# Patient Record
Sex: Male | Born: 1940 | Race: Black or African American | Hispanic: No | Smoking: Former smoker
Health system: Southern US, Community
[De-identification: ages and names within clinical notes are randomized; demographics above are authoritative.]

## PROBLEM LIST (undated history)

## (undated) DIAGNOSIS — I10 Essential (primary) hypertension: Secondary | ICD-10-CM

## (undated) DIAGNOSIS — E119 Type 2 diabetes mellitus without complications: Secondary | ICD-10-CM

## (undated) HISTORY — PX: CHOLECYSTECTOMY: SHX55

---

## 2004-03-28 ENCOUNTER — Inpatient Hospital Stay (HOSPITAL_COMMUNITY): Admission: AD | Admit: 2004-03-28 | Discharge: 2004-03-31 | Payer: Self-pay | Admitting: Cardiology

## 2004-04-17 ENCOUNTER — Ambulatory Visit: Payer: Self-pay | Admitting: Cardiology

## 2010-06-14 ENCOUNTER — Ambulatory Visit: Admit: 2010-06-14 | Payer: Self-pay | Admitting: Internal Medicine

## 2010-06-14 ENCOUNTER — Ambulatory Visit (HOSPITAL_COMMUNITY)
Admission: RE | Admit: 2010-06-14 | Discharge: 2010-06-14 | Payer: Self-pay | Source: Home / Self Care | Attending: Internal Medicine | Admitting: Internal Medicine

## 2010-06-19 LAB — GLUCOSE, CAPILLARY: Glucose-Capillary: 182 mg/dL — ABNORMAL HIGH (ref 70–99)

## 2010-06-26 NOTE — Op Note (Signed)
  NAMEDELDRICK, LINCH               ACCOUNT NO.:  [id number]  MEDICAL RECORD NO.:  [id number]          PATIENT TYPE:  AMB  LOCATION:  DAY                           FACILITY:  [facility]  PHYSICIAN:  Lionel December, M.D.    DATE OF BIRTH:  [birth year]  DATE OF PROCEDURE: DATE OF DISCHARGE:  06/14/2010                              OPERATIVE REPORT   PROCEDURE:  Colonoscopy.  INDICATIONS:  Andre Calderon is 70 year old African American male with history of colonic adenomas.  His last exam was 5 years ago and he had two polyps removed.  He is presently free of GI symptoms.  Procedures were reviewed with the patient and informed consent was obtained.  MEDS FOR CONSCIOUS SEDATION:  Demerol 50 mg IV, Versed 4 mg IV in divided dose.  FINDINGS:  Procedure performed in endoscopy suite.  The patient's vital signs and O2 sat were monitored during the procedure and remained stable.  The patient was placed in left lateral recumbent position. Rectal examination performed.  No abnormality noted on external or digital exam.  Pentax videoscope was placed through rectum and advanced under vision into sigmoid colon beyond.  Preparation was satisfactory. However, it was hard to keep the lens clean from the liquid or broth that he had left over in his colon.  As a result, somewhat blurred images.  Scope was passed into cecum which was identified by appendiceal orifice and ileocecal valve.  Pictures were taken for the record.  As the scope was withdrawn, colonic mucosa was carefully examined and no polyps and/or tumor masses were noted.  Rectal mucosa similarly was normal.  Scope was retroflexed to examine anorectal junction which was unremarkable.  Endoscope was then withdrawn.  Withdrawal time was 11 minutes.  The patient tolerated the procedure well.  FINAL DIAGNOSES: 1. Examination performed to cecum. 2. Normal colonoscopy, somewhat limited by quality of prep as above.  RECOMMENDATIONS:  Standard instructions  given.  He should continue his yearly Hemoccult and consider next exam in 5 years from now.     Lionel December, M.D.     NR/MEDQ  D:  06/14/2010  T:  06/15/2010  Job:  [id number]  cc:   Lucianne Lei, MD [facility], [place]  Electronically Signed by Lionel December M.D. on 06/26/2010 12:20:35 PM

## 2010-10-20 NOTE — Cardiovascular Report (Signed)
NAMEJHOEL, STIEG               ACCOUNT NO.:  [id number]   MEDICAL RECORD NO.:  [id number]          PATIENT TYPE:  INP   LOCATION:  4733                         FACILITY:  [facility]   PHYSICIAN:  Arturo Morton. Riley Kill, M.D. [facility] OF BIRTH:  [birth year]   DATE OF PROCEDURE:  DATE OF DISCHARGE:                              CARDIAC CATHETERIZATION   DATE OF STUDY:  March 29, 2004.   INDICATIONS:  The patient presents with chest pain.  It is somewhat  pleuritic in nature.  It was not clearly ischemic.  He had no definite  electrocardiographic abnormalities or positive enzymes.  He had a stress  test, which suggested an anteroapical defect.   SURGEON:  Arturo Morton. Riley Kill, MD, [facility].   PROCEDURES:  1.  Left heart catheterization.  2.  Selective coronary arteriography.  3.  Selective left ventriculography.   DESCRIPTION OF PROCEDURE:  The patient was brought to the cath lab and  prepped and draped in the usual fashion.  Through an anterior puncture, the  right femoral artery was easily entered.  A 6-French sheath was placed.  Central aortic left ventricular pressures were measured with a pigtail.  Ventriculography was performed in the RAO projection.  Views of the left and  right coronary arteries were then obtained in multiple angiographic  projections.  Intracoronary nitroglycerin was given into the right coronary,  and we tried multiple views of the LAD to try to lay this out well.  He was  finally taken to the holding area in satisfactory clinical condition.   HEMODYNAMIC DATA:  1.  Central aortic pressure 135/75, mean 100.  2.  Left ventricular 142/23.  3.  No gradient or pullback across the aortic valve.   ANGIOGRAPHIC DATA:  1.  Ventriculography was performed in the RAO projection.  Overall LV      function was well preserved.  No segmental abnormalities or contractures      were identified.  2.  The left main coronary artery was free of critical disease.  3.  The left  anterior descending artery coursed to the apex.  Between the      origin of the two diagonals is a focal area of tapered narrowing that      does not appear to exceed 40% luminal reduction.  There is very slight      haziness in this area, and I cannot absolutely exclude a possible      ruptured plaque, although in most views it does not appear to be high      grade.  The distal LAD wraps the apex and is free of critical disease.  4.  The circumflex provides several marginal branches and no areas of high-      grade focal obstruction are noted.  5.  The right coronary artery is a large, dominant vessel with perhaps 30%      narrowing proximally, which is likely partially spasm, and it was      relieved by nitroglycerin.  No high-grade areas of distal disease are      noted.   CONCLUSIONS:  1.  Well-preserved left ventricular function.  2.  Mid left anterior descending stenosis of approximately 40%. Cannot      entirely exclude a ruptured plaque.   PLAN:  1.  We will add the patient aspirin and Plavix and statins.  2.  We will ambulate in the hall.  3.  If he has recurrent symptoms, he may need intravascular ultrasound.       TDS/MEDQ  D:  03/29/2004  T:  03/29/2004  Job:  [id number]

## 2010-10-20 NOTE — Discharge Summary (Signed)
NAMEGENTLE, HOGE               ACCOUNT NO.:  [id number]   MEDICAL RECORD NO.:  [id number]          PATIENT TYPE:  INP   LOCATION:  4733                         FACILITY:  [facility]   PHYSICIAN:  Willa Rough, M.D.     DATE OF BIRTH:  1941-05-08   DATE OF ADMISSION:  03/28/2004  DATE OF DISCHARGE:  03/31/2004                                 DISCHARGE SUMMARY   PRIMARY CARE PHYSICIAN:  Dr. Madaline Guthrie in [place], [place]   DISCHARGE DIAGNOSES:  1.  Chest pain.  2.  New onset diabetes.  3.  Status post cardiac catheterization with well preserved left ventricular      function, a mid left anterior descending stenosis of approximately 40%,      cannot entirely exclude a ruptured plaque.   PAST MEDICAL HISTORY:  1.  Labile hypertension.  2.  New onset diabetes with a hemoglobin A1C of 9.7.  3.  Mild obesity.   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  States he has been under increased stress lately.  Cardiac  enzymes at [facility] were negative x3 sets.  EKG nonspecific at that time.  Evidently, patient had a Cardiolite prior to this admission that was  supposedly abnormal.   HOSPITAL COURSE:  Patient transferred to [facility] with plans for cardiac  catheterization.  Initial blood work:  Potassium 3.7, glucose 132, BUN 14,  creatinine 1.5.  Total cholesterol 151, triglycerides 77, HDL 36, LDL 100.  Patient placed on sodium bicarbonate protocol and one-half normal saline at  50 mL/hour, nitroglycerin paste, Protonix, sliding scale insulin with blood  sugar cover, and our p.r.n. orders.  Post catheterization patient received  300 mg of Plavix, started on Lipitor 20 mg, and received IV hydration for  six hours.  Following morning blood work stable, hemoglobin 12.3, hematocrit  36.1, platelet count 261, potassium 3.5, glucose 138, BUN 12, creatinine  1.7.  Dr. Diona Browner in to  see patient.  Cardiac rehabilitation initiated  along with diet, education regarding low salt, low fat, diabetic diet.  Patient also gently hydrated with one-half normal saline at 75 mL/hour.  Dr.  Myrtis Ser in to see patient on the morning of discharge.  Sinus rhythm.  Denies  any chest pain.  Tolerated ambulation in the hall yesterday with cardiac  rehabilitation.  Potassium 3.7, glucose 140, BUN 13, creatinine 1.4.  Vital  signs prior to discharge:  Afebrile at 97, pulse 78, respirations 18, blood  pressure 103/68.  Patient saturating 94% on room air.   DISPOSITION:  Patient being discharged home.  He has prescription for Plavix  75 mg one p.o. daily, Lipitor 20 mg one p.o. daily, and nitroglycerin as  directed for chest discomfort p.r.n.  He has also been instructed to take  aspirin 325 mg daily.   ACTIVITY:  No driving or strenuous activity x2 days.  No lifting over 10  pounds x1 week.   DIET:  He is to follow a low fat, low salt diet.   WOUND CARE:  He is to gently clean catheterization site with soap and water.  No tub bathing x1 week.  He will call our office for any swelling, pain from  catheterization site.   FOLLOWUP:  He has a follow-up appointment with Dr. Diona Browner at the [facility]  office in one to two weeks.  Patient prefers to schedule himself due to  transportation problems.  He also has been advised to follow up with Dr.  Madaline Guthrie to follow his diabetes.  I strongly encouraged him to make an  appointment next week for that.       MB/MEDQ  D:  03/31/2004  T:  03/31/2004  Job:  [id number]   cc:   Madaline Guthrie, M.D.  [place], [place]   Jonelle Sidle, M.D. [facility]

## 2015-06-22 ENCOUNTER — Encounter (INDEPENDENT_AMBULATORY_CARE_PROVIDER_SITE_OTHER): Payer: Self-pay | Admitting: *Deleted

## 2015-08-18 ENCOUNTER — Encounter (HOSPITAL_COMMUNITY): Payer: Self-pay | Admitting: *Deleted

## 2015-08-18 ENCOUNTER — Emergency Department (HOSPITAL_COMMUNITY)
Admission: EM | Admit: 2015-08-18 | Discharge: 2015-08-18 | Disposition: A | Discharge status: Home / Self Care | Payer: Medicare Other | Attending: Emergency Medicine | Admitting: Emergency Medicine

## 2015-08-18 DIAGNOSIS — M545 Low back pain: Secondary | ICD-10-CM | POA: Diagnosis present

## 2015-08-18 DIAGNOSIS — Z87891 Personal history of nicotine dependence: Secondary | ICD-10-CM | POA: Diagnosis not present

## 2015-08-18 DIAGNOSIS — M549 Dorsalgia, unspecified: Secondary | ICD-10-CM

## 2015-08-18 LAB — URINALYSIS, ROUTINE W REFLEX MICROSCOPIC
Bilirubin Urine: NEGATIVE
Glucose, UA: 250 mg/dL — AB
Hgb urine dipstick: NEGATIVE
Ketones, ur: NEGATIVE mg/dL
Leukocytes, UA: NEGATIVE
Nitrite: NEGATIVE
Protein, ur: NEGATIVE mg/dL
Specific Gravity, Urine: 1.015 (ref 1.005–1.030)
pH: 5.5 (ref 5.0–8.0)

## 2015-08-18 MED ORDER — HYDROMORPHONE HCL 1 MG/ML IJ SOLN
0.5000 mg | Freq: Once | INTRAMUSCULAR | Status: AC
Start: 2015-08-18 — End: 2015-08-18
  Administered 2015-08-18: 0.5 mg via INTRAMUSCULAR
  Filled 2015-08-18: qty 1

## 2015-08-18 MED ORDER — MELOXICAM 7.5 MG PO TABS: 7.5000 mg | ORAL_TABLET | Freq: Every day | ORAL | Status: DC | PRN

## 2015-08-18 MED ORDER — TRAMADOL HCL 50 MG PO TABS: 50.0000 mg | ORAL_TABLET | Freq: Three times a day (TID) | ORAL | Status: DC | PRN

## 2015-08-18 MED ORDER — KETOROLAC TROMETHAMINE 30 MG/ML IJ SOLN
15.0000 mg | Freq: Once | INTRAMUSCULAR | Status: AC
  Administered 2015-08-18: 15 mg via INTRAMUSCULAR
  Filled 2015-08-18: qty 1

## 2015-08-18 NOTE — Discharge Instructions (Signed)

## 2015-08-18 NOTE — ED Provider Notes (Signed)
CSN: [id number]     Arrival date & time 08/18/15  2050 History  By signing my name below, I, Emmanuella Mensah, attest that this documentation has been prepared under the direction and in the presence of Virgel Manifold, MD. Electronically Signed: Judithann Sauger, ED Scribe. 08/18/2015. 10:06 PM.     Chief Complaint  Patient presents with  . Flank Pain   The history is provided by the patient. No language interpreter was used.   HPI Comments: Andre Calderon is a 75 y.o. male who presents to the Emergency Department complaining of gradually worsening moderate ongoing bilateral lower back pain that intermittently radiates toward his lower abdomen onset 3 weeks ago. He states that the pain is worse with movement and sometimes the pain in his back "moves" from his right to the left. He states that he has tried Tylenol and Gas X with mild relief. He denies any recent fall, injuries, or trauma. His wife explains that pt had hematuria with bilateral lower back pain 2-3 weeks ago and had a CT scan at [facility] which did not show anything significant. Pt has a hx of cholecystectomy. He denies any fever, chills, dysuria, changes in BM, bowel/bladder incontinence, or n/v.    History reviewed. No pertinent past medical history. Past Surgical History  Procedure Laterality Date  . Cholecystectomy     History reviewed. No pertinent family history. Social History  Substance Use Topics  . Smoking status: Former Research scientist (life sciences)  . Smokeless tobacco: None  . Alcohol Use: No    Review of Systems  Constitutional: Negative for fever and chills.  Gastrointestinal: Positive for abdominal pain. Negative for nausea and vomiting.  Genitourinary: Negative for dysuria.  Musculoskeletal: Positive for back pain (bilateral lower).  All other systems reviewed and are negative.     Allergies  Review of patient's allergies indicates no known allergies.  Home Medications   Prior to Admission medications   Not  on File   BP 147/80 mmHg  Pulse 95  Temp(Src) 98 F (36.7 C) (Oral)  Resp 18  Ht 5\' 10"  (1.778 m)  Wt 196 lb (88.905 kg)  BMI 28.12 kg/m2  SpO2 97% Physical Exam  Constitutional: He is oriented to person, place, and time. He appears well-developed and well-nourished. No distress.  HENT:  Head: Normocephalic and atraumatic.  Eyes: Conjunctivae and EOM are normal.  Neck: Neck supple. No tracheal deviation present.  Cardiovascular: Normal rate.   Pulmonary/Chest: Effort normal. No respiratory distress.  Musculoskeletal: Normal range of motion.  Neurological: He is alert and oriented to person, place, and time.  Skin: Skin is warm and dry.  Psychiatric: He has a normal mood and affect. His behavior is normal.  Nursing note and vitals reviewed.   ED Course  Procedures (including critical care time) DIAGNOSTIC STUDIES: Oxygen Saturation is 97% on RA, normal by my interpretation.    COORDINATION OF CARE: 9:42 PM- Pt advised of plan for treatment and pt agrees. Pt will receive UA for further evaluation. He will also receive Dilaudid and Toradol. Will consult CT results from [facility] visit 2-3 weeks ago. Pt states that he does not want Percocet because he had severe side effects from his previous use.    Labs Review Labs Reviewed  URINALYSIS, ROUTINE W REFLEX MICROSCOPIC (NOT AT [facility]) - Abnormal; Notable for the following:    Glucose, UA 250 (*)    All other components within normal limits    Imaging Review No results found.   Annie Main  Wilson Singer, MD has personally reviewed and evaluated these images and lab results as part of his medical decision-making.  MDM   Final diagnoses:  Bilateral back pain, unspecified location    75 year old male with lower back pain which I suspect is musculoskeletal in etiology. No trauma. Nonfocal neuro exam. Can ambulate. Urinalysis is unremarkable. He now feels significantly better. Return precautions were discussed with patient and his  family. Outpatient follow-up otherwise.  I personally preformed the services scribed in my presence. The recorded information has been reviewed is accurate. Virgel Manifold, MD.    Virgel Manifold, MD 08/24/15 [phone]

## 2015-08-18 NOTE — ED Notes (Signed)
Pt with left flank pain for 3 weeks and pt states pain has increased today

## 2016-02-14 ENCOUNTER — Emergency Department (HOSPITAL_COMMUNITY): Payer: Medicare Other

## 2016-02-14 ENCOUNTER — Encounter (HOSPITAL_COMMUNITY): Payer: Self-pay | Admitting: Emergency Medicine

## 2016-02-14 ENCOUNTER — Emergency Department (HOSPITAL_COMMUNITY)
Admission: EM | Admit: 2016-02-14 | Discharge: 2016-02-14 | Disposition: A | Discharge status: Home / Self Care | Payer: Medicare Other | Attending: Emergency Medicine | Admitting: Emergency Medicine

## 2016-02-14 DIAGNOSIS — Z79899 Other long term (current) drug therapy: Secondary | ICD-10-CM | POA: Insufficient documentation

## 2016-02-14 DIAGNOSIS — E119 Type 2 diabetes mellitus without complications: Secondary | ICD-10-CM

## 2016-02-14 DIAGNOSIS — Y939 Activity, unspecified: Secondary | ICD-10-CM | POA: Insufficient documentation

## 2016-02-14 DIAGNOSIS — S39011A Strain of muscle, fascia and tendon of abdomen, initial encounter: Secondary | ICD-10-CM | POA: Diagnosis not present

## 2016-02-14 DIAGNOSIS — X58XXXA Exposure to other specified factors, initial encounter: Secondary | ICD-10-CM | POA: Diagnosis not present

## 2016-02-14 DIAGNOSIS — S76219A Strain of adductor muscle, fascia and tendon of unspecified thigh, initial encounter: Secondary | ICD-10-CM

## 2016-02-14 DIAGNOSIS — Z794 Long term (current) use of insulin: Secondary | ICD-10-CM

## 2016-02-14 DIAGNOSIS — Y929 Unspecified place or not applicable: Secondary | ICD-10-CM | POA: Insufficient documentation

## 2016-02-14 DIAGNOSIS — Z87891 Personal history of nicotine dependence: Secondary | ICD-10-CM | POA: Diagnosis not present

## 2016-02-14 DIAGNOSIS — N184 Chronic kidney disease, stage 4 (severe): Secondary | ICD-10-CM

## 2016-02-14 DIAGNOSIS — S3991XA Unspecified injury of abdomen, initial encounter: Secondary | ICD-10-CM | POA: Diagnosis present

## 2016-02-14 DIAGNOSIS — Z7984 Long term (current) use of oral hypoglycemic drugs: Secondary | ICD-10-CM | POA: Diagnosis not present

## 2016-02-14 DIAGNOSIS — Y999 Unspecified external cause status: Secondary | ICD-10-CM | POA: Diagnosis not present

## 2016-02-14 DIAGNOSIS — N189 Chronic kidney disease, unspecified: Secondary | ICD-10-CM

## 2016-02-14 DIAGNOSIS — IMO0001 Reserved for inherently not codable concepts without codable children: Secondary | ICD-10-CM

## 2016-02-14 HISTORY — DX: Chronic kidney disease, stage 4 (severe): N18.4

## 2016-02-14 HISTORY — DX: Type 2 diabetes mellitus without complications: E11.9

## 2016-02-14 LAB — URINALYSIS, ROUTINE W REFLEX MICROSCOPIC
Bilirubin Urine: NEGATIVE
Glucose, UA: NEGATIVE mg/dL
Hgb urine dipstick: NEGATIVE
Ketones, ur: NEGATIVE mg/dL
Leukocytes, UA: NEGATIVE
Nitrite: NEGATIVE
Protein, ur: NEGATIVE mg/dL
Specific Gravity, Urine: 1.01 (ref 1.005–1.030)
pH: 6 (ref 5.0–8.0)

## 2016-02-14 MED ORDER — DOCUSATE SODIUM 100 MG PO CAPS: 100.0000 mg | ORAL_CAPSULE | Freq: Two times a day (BID) | ORAL | 0 refills | Status: DC

## 2016-02-14 MED ORDER — ONDANSETRON 4 MG PO TBDP
4.0000 mg | ORAL_TABLET | Freq: Once | ORAL | Status: AC
  Administered 2016-02-14: 4 mg via ORAL
  Filled 2016-02-14: qty 1

## 2016-02-14 MED ORDER — HYDROCODONE-ACETAMINOPHEN 5-325 MG PO TABS: 1.0000 | ORAL_TABLET | Freq: Four times a day (QID) | ORAL | 0 refills | Status: DC | PRN

## 2016-02-14 MED ORDER — MORPHINE SULFATE (PF) 4 MG/ML IV SOLN
6.0000 mg | Freq: Once | INTRAVENOUS | Status: AC
  Administered 2016-02-14: 6 mg via INTRAMUSCULAR
  Filled 2016-02-14: qty 2

## 2016-02-14 NOTE — ED Notes (Signed)
Pt ambulated approximately 75-100 ft. Pt did not require assistance (other than help getting out of bed) but had considerable discomfort when ambulating.

## 2016-02-14 NOTE — ED Provider Notes (Signed)
TIME SEEN: 3:40 AM  CHIEF COMPLAINT: bilateral groin pain  HPI: Pt is a 75 y.o. M with h/o IDDM, CKD who presents to the ED with c/o bilateral groin pain for the past several days.  Patient reports his pain is worse with movement, walking. No injury to this area that he remembers, lifting anything heavy or increased physical exertion. Had similar symptoms in August and was diagnosed with a groin strain and given a shot of Decadron with good relief.  Denies any fevers, chills, back pain, abdominal pain, nausea, vomiting, diarrhea, bloody stools or melena, dysuria or hematuria, penile discharge, testicular pain or swelling. Having normal bowel movements without difficulty. No pain with bowel movements. No history of abdominal surgery. No history of hernia. No history of kidney stones. Denies any back pain, numbness, tingling in his legs, focal weakness. No leg swelling.  ROS: See HPI Constitutional: no fever  Eyes: no drainage  ENT: no runny nose   Cardiovascular:  no chest pain  Resp: no SOB  GI: no vomiting GU: no dysuria Integumentary: no rash  Allergy: no hives  Musculoskeletal: no leg swelling  Neurological: no slurred speech ROS otherwise negative  PAST MEDICAL HISTORY/PAST SURGICAL HISTORY:  Past Medical History:  Diagnosis Date  . Diabetes mellitus without complication ([place])     MEDICATIONS:  Prior to Admission medications   Medication Sig Start Date End Date Taking? Authorizing Provider  allopurinol (ZYLOPRIM) 100 MG tablet Take 100 mg by mouth daily.   Yes Historical Provider, MD  amLODipine (NORVASC) 5 MG tablet Take 5 mg by mouth daily.   Yes Historical Provider, MD  glimepiride (AMARYL) 4 MG tablet Take 4 mg by mouth 2 (two) times daily.   Yes Historical Provider, MD  losartan (COZAAR) 100 MG tablet Take 100 mg by mouth daily.   Yes Historical Provider, MD  traMADol (ULTRAM) 50 MG tablet Take 1 tablet (50 mg total) by mouth 3 (three) times daily as needed. 08/18/15  Yes  Virgel Manifold, MD  insulin detemir (LEVEMIR) 100 UNIT/ML injection Inject 10 Units into the skin at bedtime.    Historical Provider, MD    ALLERGIES:  Allergies  Allergen Reactions  . Lisinopril     Cough and worsening renal function  . Simvastatin     Rash, muscle pain    SOCIAL HISTORY:  Social History  Substance Use Topics  . Smoking status: Former Research scientist (life sciences)  . Smokeless tobacco: Never Used  . Alcohol use No    FAMILY HISTORY: History reviewed. No pertinent family history.  EXAM: BP 139/78   Pulse 88   Temp 98.8 F (37.1 C)   Resp 22   Ht 5\' 10"  (1.778 m)   Wt 193 lb (87.5 kg)   SpO2 96%   BMI 27.69 kg/m  CONSTITUTIONAL: Alert and oriented and responds appropriately to questions. Well-appearing; well-nourished, Afebrile, nontoxic, in no significant distress HEAD: Normocephalic EYES: Conjunctivae clear, PERRL ENT: normal nose; no rhinorrhea; moist mucous membranes NECK: Supple, no meningismus, no LAD  CARD: RRR; S1 and S2 appreciated; no murmurs, no clicks, no rubs, no gallops RESP: Normal chest excursion without splinting or tachypnea; breath sounds clear and equal bilaterally; no wheezes, no rhonchi, no rales, no hypoxia or respiratory distress, speaking full sentences ABD/GI: Normal bowel sounds; non-distended; soft, non-tender, no rebound, no guarding, no peritoneal signs, no hernias appreciated GU:  Normal external genitalia, uncircumcised male, normal penile shaft, no blood or discharge at the urethral meatus, no testicular masses or tenderness on  exam, no scrotal masses or swelling, no hernias appreciated, 2+ femoral pulses bilaterally; no perineal erythema, warmth, subcutaneous air or crepitus; no high riding testicle, normal bilateral cremasteric reflex.  Chaperone present for exam. BACK:  The back appears normal and is non-tender to palpation, there is no CVA tenderness, no midline spinal tenderness or step-off or deformity EXT: Patient has pain with flexing and  externally rotating his hips but has full range of motion in both joints without leg length discrepancy, bony deformity or bony tenderness on exam. 2+ femoral pulses bilaterally. 2+ DP pulses bilaterally. No erythema, warmth noted in any of his extremity. No joint effusion. Compartments are all soft. Normal ROM in all joints; otherwise extremities are non-tender to palpation; no edema; normal capillary refill; no cyanosis, no calf tenderness or swelling; no tenderness over the bilateral knees, tibia or fibula, ankles or feet.    SKIN: Normal color for age and race; warm; no rash NEURO: Moves all extremities equally, sensation to light touch intact diffusely, cranial nerves II through XII intact; no saddle anesthesia PSYCH: The patient's mood and manner are appropriate. Grooming and personal hygiene are appropriate.  MEDICAL DECISION MAKING: Patient here with bilateral groin pain. I suspect that this is a strain of the muscles, ligaments. Will obtain an x-ray to evaluate for possible pathologic fracture given his age. We'll also obtain urinalysis although my suspicion that this is UTI, kidney stone is low. His genital exam is otherwise normal. Nothing to suggest necrotizing fasciitis, Fournier's gangrene. He has no back pain on exam and is neurologically intact. Good pulses in his lower extremities and his extremities are warm and well-perfused. No calf tenderness or swelling to suggest DVT. He is requesting a "pain shot".  Although patient did recently receive Decadron which she reported helped his right groin pain previously, I am wary of giving him steroids without clear indication for them given he is a diabetic. We'll also avoid NSAIDs given he has a documented history of chronic kidney disease.  ED PROGRESS: Urine shows no blood or sign of infection. X-ray shows arthritic changes but no fracture or dislocation. He has been able to ambulate but states it is painful. Suspect that this is secondary  arthritis, muscle strain, ligament strain. Have recommended he take Vicodin for pain, massage, heat to this area as it appears it has helped him before and close follow-up with his PCP. Nothing to suggest gout on exam, septic arthritis, DVT, hernia, perineal infection. I feel he is safe to be discharged home.    At this time, I do not feel there is any life-threatening condition present. I have reviewed and discussed all results (EKG, imaging, lab, urine as appropriate), exam findings with patient/family. I have reviewed nursing notes and appropriate previous records.  I feel the patient is safe to be discharged home without further emergent workup and can continue workup as an outpatient as needed. Discussed usual and customary return precautions. Patient/family verbalize understanding and are comfortable with this plan.  Outpatient follow-up has been provided. All questions have been answered.    [place], DO 02/14/16 [phone]

## 2016-02-14 NOTE — ED Triage Notes (Signed)
Pt c/o groin pain since yesterday. Pt denies any injury.

## 2016-07-10 ENCOUNTER — Encounter: Payer: Self-pay | Admitting: Internal Medicine

## 2017-07-05 DIAGNOSIS — M25552 Pain in left hip: Secondary | ICD-10-CM | POA: Diagnosis present

## 2017-07-05 DIAGNOSIS — N189 Chronic kidney disease, unspecified: Secondary | ICD-10-CM | POA: Insufficient documentation

## 2017-07-05 DIAGNOSIS — Z794 Long term (current) use of insulin: Secondary | ICD-10-CM | POA: Diagnosis not present

## 2017-07-05 DIAGNOSIS — Z79899 Other long term (current) drug therapy: Secondary | ICD-10-CM | POA: Insufficient documentation

## 2017-07-05 DIAGNOSIS — E1122 Type 2 diabetes mellitus with diabetic chronic kidney disease: Secondary | ICD-10-CM | POA: Insufficient documentation

## 2017-07-05 DIAGNOSIS — M5432 Sciatica, left side: Secondary | ICD-10-CM | POA: Diagnosis not present

## 2017-07-05 DIAGNOSIS — Z87891 Personal history of nicotine dependence: Secondary | ICD-10-CM | POA: Insufficient documentation

## 2017-07-06 ENCOUNTER — Emergency Department (HOSPITAL_COMMUNITY)
Admission: EM | Admit: 2017-07-06 | Discharge: 2017-07-06 | Disposition: A | Discharge status: Home / Self Care | Payer: Medicare Other | Attending: Emergency Medicine | Admitting: Emergency Medicine

## 2017-07-06 ENCOUNTER — Emergency Department (HOSPITAL_COMMUNITY): Payer: Medicare Other

## 2017-07-06 ENCOUNTER — Encounter (HOSPITAL_COMMUNITY): Payer: Self-pay | Admitting: Emergency Medicine

## 2017-07-06 ENCOUNTER — Other Ambulatory Visit: Payer: Self-pay

## 2017-07-06 DIAGNOSIS — R52 Pain, unspecified: Secondary | ICD-10-CM

## 2017-07-06 DIAGNOSIS — M5432 Sciatica, left side: Secondary | ICD-10-CM

## 2017-07-06 LAB — CBG MONITORING, ED: Glucose-Capillary: 153 mg/dL — ABNORMAL HIGH (ref 65–99)

## 2017-07-06 MED ORDER — METHYLPREDNISOLONE 4 MG PO TBPK
ORAL_TABLET | ORAL | 0 refills | Status: DC
Start: 2017-07-06 — End: 2017-12-06

## 2017-07-06 MED ORDER — HYDROMORPHONE HCL 1 MG/ML IJ SOLN
0.5000 mg | Freq: Once | INTRAMUSCULAR | Status: AC
  Administered 2017-07-06: 0.5 mg via INTRAMUSCULAR
  Filled 2017-07-06: qty 1

## 2017-07-06 MED ORDER — KETOROLAC TROMETHAMINE 60 MG/2ML IM SOLN
30.0000 mg | Freq: Once | INTRAMUSCULAR | Status: AC
  Administered 2017-07-06: 30 mg via INTRAMUSCULAR
  Filled 2017-07-06: qty 2

## 2017-07-06 MED ORDER — METHOCARBAMOL 500 MG PO TABS: 500.0000 mg | ORAL_TABLET | Freq: Three times a day (TID) | ORAL | 0 refills | Status: DC | PRN

## 2017-07-06 NOTE — ED Provider Notes (Signed)
[facility] EMERGENCY DEPARTMENT Provider Note   CSN: [id number] Arrival date & time: 07/05/17  2351     History   Chief Complaint Chief Complaint  Patient presents with  . Hip Pain    HPI Andre Calderon is a 77 y.o. male.  Patient presents with 2-week history of left-sided hip pain and low back pain that radiates down his left leg and stops at his knee.  He has been taking Tylenol at home without relief.  He saw his PCP last week and was given a steroid shot.  He was told he had a pulled muscle.  He describes pain in his buttock that radiates down his left leg and stops at the knee.  Denies any weakness, numbness or tingling.  No bowel or bladder incontinence.  No fever or vomiting.  No history of chronic back problems.  No previous back surgeries.  No history of IV drug abuse or cancer.  Patient came in tonight because he is having difficulty walking.   The history is provided by the patient.  Hip Pain  Pertinent negatives include no chest pain, no abdominal pain and no headaches.    Past Medical History:  Diagnosis Date  . Diabetes mellitus without complication [facility])     Patient Active Problem List   Diagnosis Date Noted  . CKD (chronic kidney disease) 02/14/2016  . IDDM (insulin dependent diabetes mellitus) ([place]) 02/14/2016    Past Surgical History:  Procedure Laterality Date  . CHOLECYSTECTOMY         Home Medications    Prior to Admission medications   Medication Sig Start Date End Date Taking? Authorizing Provider  allopurinol (ZYLOPRIM) 100 MG tablet Take 100 mg by mouth daily.    [provider]  amLODipine (NORVASC) 5 MG tablet Take 5 mg by mouth daily.    [provider]  docusate sodium (COLACE) 100 MG capsule Take 1 capsule (100 mg total) by mouth every 12 (twelve) hours. 02/14/16   Ward, Delice Bison, DO  glimepiride (AMARYL) 4 MG tablet Take 4 mg by mouth 2 (two) times daily.    [provider]  HYDROcodone-acetaminophen  (NORCO/VICODIN) 5-325 MG tablet Take 1-2 tablets by mouth every 6 (six) hours as needed. 02/14/16   Ward, Delice Bison, DO  insulin detemir (LEVEMIR) 100 UNIT/ML injection Inject 10 Units into the skin at bedtime.    [provider]  losartan (COZAAR) 100 MG tablet Take 100 mg by mouth daily.    [provider]    Family History No family history on file.  Social History Social History   Tobacco Use  . Smoking status: Former Research scientist (life sciences)  . Smokeless tobacco: Never Used  Substance Use Topics  . Alcohol use: No  . Drug use: No     Allergies   Lisinopril and Simvastatin   Review of Systems Review of Systems  Constitutional: Negative for activity change, appetite change and fever.  HENT: Negative for congestion and rhinorrhea.   Respiratory: Negative for chest tightness.   Cardiovascular: Negative for chest pain.  Gastrointestinal: Negative for abdominal pain and nausea.  Genitourinary: Negative for hematuria and testicular pain.  Musculoskeletal: Positive for arthralgias, back pain and myalgias.  Skin: Negative for rash.  Neurological: Negative for dizziness, weakness and headaches.    all other systems are negative except as noted in the HPI and PMH.    Physical Exam Updated Vital Signs BP 131/76   Pulse 85   Temp 98.7 F (37.1 C)  Resp 18   Ht 5\' 10"  (1.778 m)   Wt 93.9 kg (207 lb)   SpO2 98%   BMI 29.70 kg/m   Physical Exam  Constitutional: He is oriented to person, place, and time. He appears well-developed and well-nourished. No distress.  HENT:  Head: Normocephalic and atraumatic.  Mouth/Throat: Oropharynx is clear and moist. No oropharyngeal exudate.  Eyes: Conjunctivae and EOM are normal. Pupils are equal, round, and reactive to light.  Neck: Normal range of motion. Neck supple.  No meningismus.  Cardiovascular: Normal rate, regular rhythm, normal heart sounds and intact distal pulses.  No murmur heard. Pulmonary/Chest: Effort normal and  breath sounds normal. No respiratory distress.  Abdominal: Soft. There is no tenderness. There is no rebound and no guarding.  Musculoskeletal: Normal range of motion. He exhibits tenderness. He exhibits no edema.  Left SI joint tenderness, no midline tenderness  5/5 strength in bilateral lower extremities. Ankle plantar and dorsiflexion intact. Great toe extension intact bilaterally. +2 DP and PT pulses. +2 patellar reflexes bilaterally. Normal gait.   Neurological: He is alert and oriented to person, place, and time. No cranial nerve deficit. He exhibits normal muscle tone. Coordination normal.  No ataxia on finger to nose bilaterally. No pronator drift. 5/5 strength throughout. CN 2-12 intact.Equal grip strength. Sensation intact.   Skin: Skin is warm.  Psychiatric: He has a normal mood and affect. His behavior is normal.  Nursing note and vitals reviewed.    ED Treatments / Results  Labs (all labs ordered are listed, but only abnormal results are displayed) Labs Reviewed  CBG MONITORING, ED - Abnormal; Notable for the following components:      Result Value   Glucose-Capillary 153 (*)    All other components within normal limits    EKG  EKG Interpretation None       Radiology Dg Lumbar Spine Complete  Result Date: 07/06/2017 CLINICAL DATA:  Low back pain and left hip pain for 2 weeks. Pain radiates to the left leg. Hurts to bear weight. History of diabetes. No recent injury. EXAM: LUMBAR SPINE - COMPLETE 4+ VIEW COMPARISON:  None. FINDINGS: Five lumbar type vertebral bodies. Normal alignment of the lumbar spine. No vertebral compression deformities. No focal bone lesion or bone destruction. Diffuse degenerative change throughout the lumbar spine with prominent endplate hypertrophic changes. 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 Vascular calcifications in the aorta. 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 No acute displaced fractures identified. Aortic atherosclerosis. Electronically Signed   By: Lucienne Capers M.D.   On: 07/06/2017 01:33   Dg Hip Unilat With Pelvis 2-3 Views Left  Result Date: 07/06/2017 CLINICAL DATA:  Low back pain and left hip pain for 2 weeks. Painful to bear weight. History of diabetes. No recent injury. EXAM: DG HIP (WITH OR WITHOUT PELVIS) 2-3V LEFT COMPARISON:  None. FINDINGS: Degenerative changes in the lower lumbar spine and in both hips. Prominent acetabular osteophytes may contribute to impingement syndrome in the hips. Pelvis and left hip appear intact. No evidence of acute fracture or dislocation. No focal bone lesion or bone destruction. SI joints and symphysis pubis are not displaced. Visualized sacrum appears intact. IMPRESSION: Prominent hypertrophic degenerative changes in the lower lumbar spine and both hips. No evidence of acute fracture or dislocation. Electronically Signed   By: Lucienne Capers M.D.   On: 07/06/2017 01:30    Procedures  Procedures (including critical care time)  Medications Ordered in ED Medications  ketorolac (TORADOL) injection 30 mg (not administered)  HYDROmorphone (DILAUDID) injection 0.5 mg (not administered)     Initial Impression / Assessment and Plan / ED Course  I have reviewed the triage vital signs and the nursing notes.  Pertinent labs & imaging results that were available during my care of the patient were reviewed by me and considered in my medical decision making (see chart for details).    Presentation consistent with sciatica.  Patient is neurovascularly intact.  No neurological red flags.  Low suspicion for cord compression or cauda equina.  Patient without any motor or sensory deficits.  Patient will be treated with anti-inflammatories.  Discussed steroids may elevate his blood sugar.  Follow-up with PCP.  Discussed with patient he  needed may need MRI if symptoms persist.  He knows to watch his sugars carefully while on steroids.  Return precautions discussed including weakness, numbness, incontinence or any other concerns.  Final Clinical Impressions(s) / ED Diagnoses   Final diagnoses:  Pain  Sciatica of left side    ED Discharge Orders    None       Aretha Levi, Annie Main, MD 07/06/17 [phone]

## 2017-07-06 NOTE — ED Triage Notes (Signed)
Pt c/o left hip pain x 2 weeks. Pain radiates to left leg. Hurts to bear weight. Seen by pcp last Monday and dx with pulled muscle.

## 2017-07-06 NOTE — Discharge Instructions (Signed)
Monitor your blood sugars closely while on the steroids.  Follow-up with your doctor.  Return to the ED if you develop weakness, numbness, incontinence or any other concerns.

## 2017-12-06 ENCOUNTER — Encounter (HOSPITAL_COMMUNITY): Payer: Self-pay | Admitting: *Deleted

## 2017-12-06 ENCOUNTER — Other Ambulatory Visit: Payer: Self-pay

## 2017-12-06 ENCOUNTER — Emergency Department (HOSPITAL_COMMUNITY): Payer: Medicare Other

## 2017-12-06 ENCOUNTER — Emergency Department (HOSPITAL_COMMUNITY)
Admission: EM | Admit: 2017-12-06 | Discharge: 2017-12-06 | Disposition: A | Discharge status: Home / Self Care | Payer: Medicare Other | Attending: Emergency Medicine | Admitting: Emergency Medicine

## 2017-12-06 DIAGNOSIS — R51 Headache: Secondary | ICD-10-CM | POA: Diagnosis not present

## 2017-12-06 DIAGNOSIS — Z794 Long term (current) use of insulin: Secondary | ICD-10-CM | POA: Insufficient documentation

## 2017-12-06 DIAGNOSIS — M542 Cervicalgia: Secondary | ICD-10-CM | POA: Diagnosis present

## 2017-12-06 DIAGNOSIS — N189 Chronic kidney disease, unspecified: Secondary | ICD-10-CM | POA: Diagnosis not present

## 2017-12-06 DIAGNOSIS — Z79899 Other long term (current) drug therapy: Secondary | ICD-10-CM | POA: Insufficient documentation

## 2017-12-06 DIAGNOSIS — Z87891 Personal history of nicotine dependence: Secondary | ICD-10-CM | POA: Diagnosis not present

## 2017-12-06 DIAGNOSIS — I129 Hypertensive chronic kidney disease with stage 1 through stage 4 chronic kidney disease, or unspecified chronic kidney disease: Secondary | ICD-10-CM | POA: Diagnosis not present

## 2017-12-06 DIAGNOSIS — E119 Type 2 diabetes mellitus without complications: Secondary | ICD-10-CM | POA: Insufficient documentation

## 2017-12-06 HISTORY — DX: Essential (primary) hypertension: I10

## 2017-12-06 LAB — CBG MONITORING, ED: Glucose-Capillary: 141 mg/dL — ABNORMAL HIGH (ref 70–99)

## 2017-12-06 MED ORDER — TRAMADOL HCL 50 MG PO TABS: 50.0000 mg | ORAL_TABLET | Freq: Four times a day (QID) | ORAL | 0 refills | Status: DC | PRN

## 2017-12-06 MED ORDER — NAPROXEN 500 MG PO TABS: 500.0000 mg | ORAL_TABLET | Freq: Two times a day (BID) | ORAL | 0 refills | Status: DC

## 2017-12-06 NOTE — Discharge Instructions (Addendum)
Call and make an appointment with Dr. Sherwood Gambler or 1 of his associates in the next 1 to 2 weeks

## 2017-12-06 NOTE — ED Triage Notes (Signed)
Pt with left sided neck and left shoulder pain ongoing for 3 weeks.  Seen Dr. Brynda Greathouse in [place] 2 weeks ago and dx with sinus infection, pt took steriods for it.

## 2017-12-06 NOTE — ED Provider Notes (Signed)
[facility] EMERGENCY DEPARTMENT Provider Note   CSN: [id number] Arrival date & time: 12/06/17  1727     History   Chief Complaint Chief Complaint  Patient presents with  . Neck Pain    x 3 weeks    HPI Andre Calderon is a 77 y.o. male.  Patient complains of neck pain and pain radiating down her left arm.  Patient also complains of tingling in the left arm  The history is provided by the patient. No language interpreter was used.  Illness  This is a new problem. The current episode started more than 1 week ago. The problem occurs constantly. The problem has not changed since onset.Pertinent negatives include no chest pain, no abdominal pain and no headaches. Nothing aggravates the symptoms. Nothing relieves the symptoms. He has tried nothing for the symptoms. The treatment provided no relief.    Past Medical History:  Diagnosis Date  . Diabetes mellitus without complication ([place])   . Hypertension     Patient Active Problem List   Diagnosis Date Noted  . CKD (chronic kidney disease) 02/14/2016  . IDDM (insulin dependent diabetes mellitus) ([place]) 02/14/2016    Past Surgical History:  Procedure Laterality Date  . CHOLECYSTECTOMY          Home Medications    Prior to Admission medications   Medication Sig Start Date End Date Taking? Authorizing Provider  allopurinol (ZYLOPRIM) 100 MG tablet Take 100 mg by mouth 2 (two) times daily.    Yes [provider]  amLODipine (NORVASC) 5 MG tablet Take 5 mg by mouth 2 (two) times daily.    Yes [provider]  calcium-vitamin D (OSCAL WITH D) 500-200 MG-UNIT tablet Take 1 tablet by mouth.   Yes [provider]  glimepiride (AMARYL) 4 MG tablet Take 4 mg by mouth 2 (two) times daily.   Yes [provider]  insulin detemir (LEVEMIR) 100 UNIT/ML injection Inject 20 Units into the skin 2 (two) times daily. 20 Units in the Morning and 20 Units at night.   Yes [provider]  loratadine  (CLARITIN) 10 MG tablet Take 10 mg by mouth daily.   Yes [provider]  losartan (COZAAR) 100 MG tablet Take 50 mg by mouth daily.    Yes [provider]  vitamin C (ASCORBIC ACID) 500 MG tablet Take 500 mg by mouth daily.   Yes [provider]  naproxen (NAPROSYN) 500 MG tablet Take 1 tablet (500 mg total) by mouth 2 (two) times daily. 12/06/17   Milton Ferguson, MD  traMADol (ULTRAM) 50 MG tablet Take 1 tablet (50 mg total) by mouth every 6 (six) hours as needed. 12/06/17   Milton Ferguson, MD    Family History History reviewed. No pertinent family history.  Social History Social History   Tobacco Use  . Smoking status: Former Research scientist (life sciences)  . Smokeless tobacco: Never Used  Substance Use Topics  . Alcohol use: No  . Drug use: No     Allergies   Lisinopril and Simvastatin   Review of Systems Review of Systems  Constitutional: Negative for appetite change and fatigue.  HENT: Negative for congestion, ear discharge and sinus pressure.        Neck pain  Eyes: Negative for discharge.  Respiratory: Negative for cough.   Cardiovascular: Negative for chest pain.  Gastrointestinal: Negative for abdominal pain and diarrhea.  Genitourinary: Negative for frequency and hematuria.  Musculoskeletal: Negative for back pain.  Skin: Negative for rash.  Neurological: Negative for seizures and headaches.  Psychiatric/Behavioral: Negative for hallucinations.     Physical Exam Updated Vital Signs BP 122/79 (BP Location: Right Arm)   Pulse 76   Temp 98 F (36.7 C) (Oral)   Resp 18   Ht 5\' 10"  (1.778 m)   Wt 94.3 kg (208 lb)   SpO2 100%   BMI 29.84 kg/m   Physical Exam  Constitutional: He is oriented to person, place, and time. He appears well-developed.  HENT:  Head: Normocephalic.  Tender left lateral neck  Eyes: Conjunctivae and EOM are normal. No scleral icterus.  Neck: Neck supple. No thyromegaly present.  Cardiovascular: Normal rate and regular rhythm.  Exam reveals no gallop and no friction rub.  No murmur heard. Pulmonary/Chest: No stridor. He has no wheezes. He has no rales. He exhibits no tenderness.  Abdominal: He exhibits no distension. There is no tenderness. There is no rebound.  Musculoskeletal: Normal range of motion. He exhibits no edema.  Lymphadenopathy:    He has no cervical adenopathy.  Neurological: He is oriented to person, place, and time. He exhibits normal muscle tone. Coordination normal.  Skin: No rash noted. No erythema.  Psychiatric: He has a normal mood and affect. His behavior is normal.     ED Treatments / Results  Labs (all labs ordered are listed, but only abnormal results are displayed) Labs Reviewed  CBG MONITORING, ED - Abnormal; Notable for the following components:      Result Value   Glucose-Capillary 141 (*)    All other components within normal limits    EKG None  Radiology Ct Head Wo Contrast  Result Date: 12/06/2017 CLINICAL DATA:  77 year old male with LEFT neck pain 3 weeks and chronic headache. EXAM: CT HEAD WITHOUT CONTRAST CT CERVICAL SPINE WITHOUT CONTRAST TECHNIQUE: Multidetector CT imaging of the head and cervical spine was performed following the standard protocol without intravenous contrast. Multiplanar CT image reconstructions of the cervical spine were also generated. COMPARISON:  None. FINDINGS: CT HEAD FINDINGS Brain: No evidence of acute infarction, hemorrhage, hydrocephalus, extra-axial collection or mass lesion/mass effect. Vascular: Intracranial atherosclerotic calcifications noted. Skull: Normal. Negative for fracture or focal lesion. Sinuses/Orbits: No acute finding. Other: None. CT CERVICAL SPINE FINDINGS Alignment: Normal. Skull base and vertebrae: No acute fracture. No primary bone lesion or focal pathologic process. Soft tissues and spinal canal: No prevertebral fluid or swelling. No visible canal hematoma. Disc levels: Confluent anterior and posterior spondylosis noted  throughout most of the cervical spine noted with multilevel degenerative disc disease noted. These findings contribute to the following: C2-3: With mild broad-based disc bulge, mild central spinal narrowing. C3-4: With large diffuse disc bulge/protrusion, severe central spinal narrowing with cord flattening and mild to moderate biforaminal narrowing. C4-5: Moderate to severe central spinal narrowing with cord flattening and mild to moderate biforaminal narrowing. C5-6: Moderate central spinal and mild to moderate biforaminal narrowing. C6-7: Mild to moderate central spinal narrowing with flattening of the LEFT aspect of the cord. Mild-to-moderate RIGHT and moderate LEFT foraminal narrowing. C7-T1: Moderate central spinal and moderate to severe biforaminal narrowing. Upper chest: Asymmetric enlargement of the LEFT thyroid versus 5 cm LEFT thyroid nodule noted. Other: None IMPRESSION: 1. No evidence of acute intracranial abnormality. 2. 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 Broad-based disc bulges/protrusions also cause severe central spinal narrowing with cord flattening at C3-4 and moderate to severe central spinal with cord flattening at C4-5.  3. Enlarged LEFT thyroid versus 5 cm LEFT thyroid nodule. Thyroid ultrasound recommended. Electronically Signed   By: Margarette Canada M.D.   On: 12/06/2017 19:27   Ct Cervical Spine Wo Contrast  Result Date: 12/06/2017 CLINICAL DATA:  77 year old male with LEFT neck pain 3 weeks and chronic headache. EXAM: CT HEAD WITHOUT CONTRAST CT CERVICAL SPINE WITHOUT CONTRAST TECHNIQUE: Multidetector CT imaging of the head and cervical spine was performed following the standard protocol without intravenous contrast. Multiplanar CT image reconstructions of the cervical spine were also generated. COMPARISON:  None. FINDINGS: CT HEAD FINDINGS Brain: No evidence of acute  infarction, hemorrhage, hydrocephalus, extra-axial collection or mass lesion/mass effect. Vascular: Intracranial atherosclerotic calcifications noted. Skull: Normal. Negative for fracture or focal lesion. Sinuses/Orbits: No acute finding. Other: None. CT CERVICAL SPINE FINDINGS Alignment: Normal. Skull base and vertebrae: No acute fracture. No primary bone lesion or focal pathologic process. Soft tissues and spinal canal: No prevertebral fluid or swelling. No visible canal hematoma. Disc levels: Confluent anterior and posterior spondylosis noted throughout most of the cervical spine noted with multilevel degenerative disc disease noted. These findings contribute to the following: C2-3: With mild broad-based disc bulge, mild central spinal narrowing. C3-4: With large diffuse disc bulge/protrusion, severe central spinal narrowing with cord flattening and mild to moderate biforaminal narrowing. C4-5: Moderate to severe central spinal narrowing with cord flattening and mild to moderate biforaminal narrowing. C5-6: Moderate central spinal and mild to moderate biforaminal narrowing. C6-7: Mild to moderate central spinal narrowing with flattening of the LEFT aspect of the cord. Mild-to-moderate RIGHT and moderate LEFT foraminal narrowing. C7-T1: Moderate central spinal and moderate to severe biforaminal narrowing. Upper chest: Asymmetric enlargement of the LEFT thyroid versus 5 cm LEFT thyroid nodule noted. Other: None IMPRESSION: 1. No evidence of acute intracranial abnormality. 2. 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 Broad-based disc bulges/protrusions also cause severe central spinal narrowing with cord flattening at C3-4 and moderate to severe central spinal with cord flattening at C4-5. 3. Enlarged LEFT thyroid versus 5 cm LEFT thyroid nodule. Thyroid ultrasound recommended. Electronically Signed    By: Margarette Canada M.D.   On: 12/06/2017 19:27    Procedures Procedures (including critical care time)  Medications Ordered in ED Medications - No data to display   Initial Impression / Assessment and Plan / ED Course  I have reviewed the triage vital signs and the nursing notes.  Pertinent labs & imaging results that were available during my care of the patient were reviewed by me and considered in my medical decision making (see chart for details).     Patient with cervical neuritis.  He will be placed on Naprosyn and Ultram and referred to neurosurgery  Final Clinical Impressions(s) / ED Diagnoses   Final diagnoses:  Neck pain    ED Discharge Orders        Ordered    naproxen (NAPROSYN) 500 MG tablet  2 times daily     12/06/17 2007    traMADol (ULTRAM) 50 MG tablet  Every 6 hours PRN     12/06/17 2007       Milton Ferguson, MD 12/06/17 2013

## 2017-12-10 ENCOUNTER — Other Ambulatory Visit (HOSPITAL_COMMUNITY): Payer: Self-pay | Admitting: Internal Medicine

## 2017-12-10 DIAGNOSIS — E041 Nontoxic single thyroid nodule: Secondary | ICD-10-CM

## 2017-12-13 ENCOUNTER — Ambulatory Visit (HOSPITAL_COMMUNITY)
Admission: RE | Admit: 2017-12-13 | Discharge: 2017-12-13 | Disposition: A | Discharge status: Home / Self Care | Payer: Medicare Other | Source: Ambulatory Visit | Attending: Internal Medicine | Admitting: Internal Medicine

## 2017-12-13 DIAGNOSIS — E041 Nontoxic single thyroid nodule: Secondary | ICD-10-CM | POA: Diagnosis not present

## 2018-12-08 ENCOUNTER — Other Ambulatory Visit: Payer: Self-pay | Admitting: Internal Medicine

## 2018-12-08 DIAGNOSIS — E041 Nontoxic single thyroid nodule: Secondary | ICD-10-CM

## 2018-12-09 ENCOUNTER — Other Ambulatory Visit (HOSPITAL_COMMUNITY): Payer: Self-pay | Admitting: Internal Medicine

## 2018-12-09 DIAGNOSIS — E041 Nontoxic single thyroid nodule: Secondary | ICD-10-CM

## 2018-12-16 ENCOUNTER — Ambulatory Visit (HOSPITAL_COMMUNITY)
Admission: RE | Admit: 2018-12-16 | Discharge: 2018-12-16 | Disposition: A | Discharge status: Home / Self Care | Payer: Medicare Other | Source: Ambulatory Visit | Attending: Internal Medicine | Admitting: Internal Medicine

## 2018-12-16 ENCOUNTER — Other Ambulatory Visit: Payer: Self-pay

## 2018-12-16 DIAGNOSIS — E041 Nontoxic single thyroid nodule: Secondary | ICD-10-CM | POA: Diagnosis present

## 2019-12-08 ENCOUNTER — Other Ambulatory Visit (HOSPITAL_COMMUNITY): Payer: Self-pay | Admitting: Internal Medicine

## 2019-12-08 ENCOUNTER — Other Ambulatory Visit: Payer: Self-pay | Admitting: Internal Medicine

## 2019-12-08 DIAGNOSIS — E041 Nontoxic single thyroid nodule: Secondary | ICD-10-CM

## 2019-12-15 ENCOUNTER — Other Ambulatory Visit: Payer: Self-pay

## 2019-12-15 ENCOUNTER — Ambulatory Visit (HOSPITAL_COMMUNITY)
Admission: RE | Admit: 2019-12-15 | Discharge: 2019-12-15 | Disposition: A | Discharge status: Home / Self Care | Payer: Medicare Other | Source: Ambulatory Visit | Attending: Internal Medicine | Admitting: Internal Medicine

## 2019-12-15 DIAGNOSIS — E041 Nontoxic single thyroid nodule: Secondary | ICD-10-CM | POA: Insufficient documentation

## 2020-01-29 ENCOUNTER — Encounter: Payer: Self-pay | Admitting: Nurse Practitioner

## 2020-01-29 ENCOUNTER — Ambulatory Visit (INDEPENDENT_AMBULATORY_CARE_PROVIDER_SITE_OTHER): Payer: Medicare Other | Admitting: Nurse Practitioner

## 2020-01-29 ENCOUNTER — Other Ambulatory Visit: Payer: Self-pay

## 2020-01-29 VITALS — BP 121/73 | HR 74 | Ht 70.0 in | Wt 217.4 lb

## 2020-01-29 DIAGNOSIS — E042 Nontoxic multinodular goiter: Secondary | ICD-10-CM | POA: Diagnosis not present

## 2020-01-29 NOTE — Patient Instructions (Signed)
Thyroid Needle Biopsy  Thyroid needle biopsy is a procedure to remove small samples of tissue or fluid from the thyroid gland. The samples are then examined under a microscope. The thyroid is a gland in the lower front area of the neck. It produces hormones that affect many important body processes, including growth and development, body temperature, and how the body uses food for energy (metabolism). This procedure is often done to help diagnose cancer, infection, or other problems with the thyroid. During this procedure, a thin needle (fine needle) is inserted through the skin and into the thyroid gland. This is less invasive than a procedure in which an incision is made over the thyroid (open thyroid biopsy). Sometimes, an open thyroid biopsy may be done during a different surgery, such as surgery to remove a part or a whole section (lobe) of the thyroid gland (open lobectomy). Tell a health care provider about:  Any allergies you have.  All medicines you are taking, including vitamins, herbs, eye drops, creams, and over-the-counter medicines.  Any problems you or family members have had with anesthetic medicines.  Any blood disorders you have.  Any surgeries you have had.  Any medical conditions you have.  Whether you are pregnant or may be pregnant. What are the risks? Generally, this is a safe procedure. However, problems may occur, including:  Infection.  Bleeding.  Allergic reactions to medicines.  Damage to nerves or blood vessels in the neck. What happens before the procedure? Medicines  Ask your health care provider about: ? Changing or stopping your regular medicines. This is especially important if you are taking diabetes medicines or blood thinners. ? Taking medicines such as aspirin and ibuprofen. These medicines can thin your blood. Do not take these medicines unless your health care provider tells you to take them. ? Taking over-the-counter medicines, vitamins,  herbs, and supplements. General instructions  You may have blood tests.  You may have an ultrasound before or during the needle biopsy. What happens during the procedure?  You will be asked to lie on your back with your head tipped backward to extend your neck. You may be asked to avoid coughing, talking, swallowing, or making sounds during some parts of the procedure.  To lower your risk of infection: ? Your health care team will wash or sanitize their hands. ? The skin over your thyroid will be cleaned with a germ-killing (antiseptic) solution.  A local anesthetic (lidocaine) may be injected into the skin over your thyroid, to numb the area.  An ultrasound may be done to help guide the needle to the desired area of your thyroid.  A fine needle will be inserted into your thyroid. The needle will be used to remove tissue or fluid samples as needed. The samples will be sent to a lab for examination.  The needle will be removed.  Pressure may be applied to your neck to reduce swelling and stop bleeding. The procedure may vary among health care providers and hospitals. What happens after the procedure?  It is up to you to get the results of your procedure. Ask your health care provider, or the department that is doing the procedure, when your results will be ready. Summary  Thyroid needle biopsy is a procedure to remove small samples of tissue or fluid from the thyroid gland.  During this procedure, a thin needle (fine needle) is inserted through the skin and into the thyroid gland. This is less invasive than a procedure in which an incision  is made over the thyroid (open thyroid biopsy).  You will be asked to lie on your back with your head tipped backward to extend your neck. You may be asked to avoid coughing, talking, swallowing, or making sounds during some parts of the procedure. This information is not intended to replace advice given to you by your health care provider. Make  sure you discuss any questions you have with your health care provider. Document Revised: 05/03/2017 Document Reviewed: 03/04/2017 Elsevier Patient Education  2020 [employer].

## 2020-01-29 NOTE — Progress Notes (Signed)
01/29/20  Endocrinology Consult Note  BELL CAI is being seen in consultation for multinodular goiter as requested by Eber Hong, MD.  Subjective:     Patient ID: Andre Calderon, male   DOB: 05-29-1941, 79 y.o.   MRN: [id number]  HPI Thyroid U/S: Completed on 12/15/19 showing multiple nodules, one of which met criteria for biopsy.  Pt denies - feeling nodules in neck - hoarseness - dysphagia - choking - SOB with lying down  I reviewed pt's thyroid tests:  performed on 01/19/20 at [facility] showing TSH of 2.05 and Free T4 of 0.92.  Pt denies: - fatigue - heat intolerance/cold intolerance - tremors - palpitations - anxiety/depression - diarrhea/constipation - weight loss/weight gain - dry skin - hair loss  No FH of thyroid ds. No FH of thyroid cancer. No h/o radiation tx to head or neck.  No seaweed or kelp. No recent contrast studies. No steroid use. No herbal supplements. No Biotin supplements or Hair, Skin and Nails vitamins.  Past Medical History:  Diagnosis Date  . Diabetes mellitus without complication ([place])   . Hypertension    Past Surgical History:  Procedure Laterality Date  . CHOLECYSTECTOMY     Social History   Socioeconomic History  . Marital status: Widowed    Spouse name: Not on file  . Number of children: Not on file  . Years of education: Not on file  . Highest education level: Not on file  Occupational History  . Not on file  Tobacco Use  . Smoking status: Former Research scientist (life sciences)  . Smokeless tobacco: Never Used  Substance and Sexual Activity  . Alcohol use: No  . Drug use: No  . Sexual activity: Not on file  Other Topics Concern  . Not on file  Social History Narrative  . Not on file   Social Determinants of Health   Financial Resource Strain:   . Difficulty of Paying Living Expenses: Not on file  Food Insecurity:   . Worried About Charity fundraiser in the Last Year: Not on file  . Ran Out of Food in the Last Year: Not on  file  Transportation Needs:   . Lack of Transportation (Medical): Not on file  . Lack of Transportation (Non-Medical): Not on file  Physical Activity:   . Days of Exercise per Week: Not on file  . Minutes of Exercise per Session: Not on file  Stress:   . Feeling of Stress : Not on file  Social Connections:   . Frequency of Communication with Friends and Family: Not on file  . Frequency of Social Gatherings with Friends and Family: Not on file  . Attends Religious Services: Not on file  . Active Member of Clubs or Organizations: Not on file  . Attends Archivist Meetings: Not on file  . Marital Status: Not on file  Intimate Partner Violence:   . Fear of Current or Ex-Partner: Not on file  . Emotionally Abused: Not on file  . Physically Abused: Not on file  . Sexually Abused: Not on file   Review of Systems   Constitutional: no weight gain/loss, no fatigue, no subjective hyperthermia/hypothermia Eyes: no blurry vision, no xerophthalmia ENT: no sore throat, no nodules palpated in throat, no dysphagia/odynophagia, no hoarseness Cardiovascular: no CP/SOB/palpitations/leg swelling Respiratory: no cough/SOB Gastrointestinal: no N/V/D/C Musculoskeletal: no muscle/joint aches Skin: no rashes Neurological: no tremors/numbness/tingling/dizziness Psychiatric: no depression/anxiety    Objective:  BP 121/73 (BP Location: Right Arm, Patient Position: Sitting)  Pulse 74   Ht 5' 10"  (1.778 m)   Wt 217 lb 6.4 oz (98.6 kg)   BMI 31.19 kg/m  Wt Readings from Last 3 Encounters:  01/29/20 217 lb 6.4 oz (98.6 kg)  12/06/17 208 lb (94.3 kg)  07/06/17 207 lb (93.9 kg)   BP Readings from Last 3 Encounters:  01/29/20 121/73  12/06/17 122/79  07/06/17 114/74    Physical Exam- Limited  Constitutional:  Body mass index is 31.19 kg/m. , not in acute distress, normal state of mind Eyes:  EOMI, no exophthalmos Neck: Supple Thyroid: No gross goiter, no palpable nodules, no  tenderness to palpation Cardiovascular: RRR, no murmers, rubs, or gallops, no edema Respiratory: Adequate breathing efforts, no crackles, rales, rhonchi, or wheezing Musculoskeletal: no gross deformities, strength intact in all four extremities, no gross restriction of joint movements Skin:  no rashes, no hyperemia Neurological: no tremor with outstretched hands    Assessment:     1) Multinodular Goiter: Thyroid US done on 12/15/19: CLINICAL DATA:  79 year old male with a history of thyroid  EXAM: THYROID ULTRASOUND  TECHNIQUE: Ultrasound examination of the thyroid gland and adjacent soft tissues was performed.  COMPARISON:  12/16/2018, 12/13/2017  FINDINGS: Parenchymal Echotexture: Normal  Isthmus: 0.6 cm  Right lobe: 5.0 cm x 1.8 cm x 2.0 cm  Left lobe: 4.9 cm x 0.9 cm x 2.1 cm  _________________________________________________________  Estimated total number of nodules >/= 1 cm: 3  Number of spongiform nodules >/=  2 cm not described below (TR1): 0  Number of mixed cystic and solid nodules >/= 1.5 cm not described below (Saxis): 0  _________________________________________________________  Nodule labeled 1 right inferior thyroid, 1.8 cm, remains TR 4 and meets criteria for biopsy given the size.  Nodule labeled 2 in the mid left thyroid, decreased in size from the prior, now 1.7 cm and does not meet criteria for further surveillance or biopsy.  Nodule # 3:  Location: Left; Superior  Maximum size: 1.1 cm; Other 2 dimensions: 0.9 cm x 0.8 cm  Composition: solid/almost completely solid (2)  Echogenicity: hyperechoic (1)  Shape: not taller-than-wide (0)  Margins: ill-defined (0)  Echogenic foci: none (0)  ACR TI-RADS total points: 3.  ACR TI-RADS risk category: TR3 (3 points).  ACR TI-RADS recommendations:  Nodule does not meet criteria for surveillance or  biopsy  _________________________________________________________  No adenopathy  IMPRESSION: Multinodular thyroid.  Right inferior thyroid nodule (labeled 1, 1.8 cm, TR 4) now meets criteria for biopsy, as designated by the newly established ACR TI-RADS criteria, and referral for biopsy is recommended.    Plan:     1) Multinodular Goiter: - I reviewed the images of his thyroid ultrasound along with the patient and will check more comprehensive thyroid panel prior to next visit.  -Pt does not have a thyroid cancer family history or a personal history of RxTx to head/neck. All these would favor benignity.   - the only way that we can tell exactly if it is cancer or not is by doing a thyroid biopsy (FNA). I explained what the test entails. - We discussed about other options, to wait for another 6 months to a year and see if the nodule grows, and only intervene at that time.  - patient decided to have the FNA done now >> I ordered this.   - I explained that this is not cancer, we can continue to follow him on a yearly basis, and check another ultrasound in another year or  2. - He should let me know if he develops neck compression symptoms, in that case, we might need to do either lobectomy or thyroidectomy - I did explain that, while thyroid surgery is not a complicated one, it still can have side effects and also he might have a risk of ~25% of becoming hypothyroid after hemithyroidectomy.  - I'll see him back in 4 weeks to discuss biopsy results.   - Time spent with the patient: 45 minutes, of which >50% was spent in obtaining information about his symptoms, reviewing his previous labs, evaluations, and treatments, counseling him about his multinodular goiter, and developing a plan for long term treatment; he had a number of questions which I addressed.    Follow Up Return in about 4 weeks (around 02/26/2020) for Thyroid follow up, Previsit labs, ultrasound with biopsy prior to  visit.   Rayetta Pigg, FNP-BC [facility] Phone: [phone] Fax: [phone]

## 2020-02-05 ENCOUNTER — Other Ambulatory Visit: Payer: Self-pay

## 2020-02-05 ENCOUNTER — Emergency Department (HOSPITAL_COMMUNITY)
Admission: EM | Admit: 2020-02-05 | Discharge: 2020-02-06 | Disposition: A | Discharge status: Home / Self Care | Payer: Medicare Other | Attending: Emergency Medicine | Admitting: Emergency Medicine

## 2020-02-05 ENCOUNTER — Emergency Department (HOSPITAL_COMMUNITY): Payer: Medicare Other

## 2020-02-05 ENCOUNTER — Encounter (HOSPITAL_COMMUNITY): Payer: Self-pay

## 2020-02-05 DIAGNOSIS — R079 Chest pain, unspecified: Secondary | ICD-10-CM | POA: Insufficient documentation

## 2020-02-05 DIAGNOSIS — R251 Tremor, unspecified: Secondary | ICD-10-CM | POA: Diagnosis not present

## 2020-02-05 DIAGNOSIS — Z5321 Procedure and treatment not carried out due to patient leaving prior to being seen by health care provider: Secondary | ICD-10-CM | POA: Diagnosis not present

## 2020-02-05 LAB — CBC
HCT: 38.9 % — ABNORMAL LOW (ref 39.0–52.0)
Hemoglobin: 12.4 g/dL — ABNORMAL LOW (ref 13.0–17.0)
MCH: 28 pg (ref 26.0–34.0)
MCHC: 31.9 g/dL (ref 30.0–36.0)
MCV: 87.8 fL (ref 80.0–100.0)
Platelets: 232 10*3/uL (ref 150–400)
RBC: 4.43 MIL/uL (ref 4.22–5.81)
RDW: 15.8 % — ABNORMAL HIGH (ref 11.5–15.5)
WBC: 7.6 10*3/uL (ref 4.0–10.5)
nRBC: 0 % (ref 0.0–0.2)

## 2020-02-05 LAB — BASIC METABOLIC PANEL
Anion gap: 9 (ref 5–15)
BUN: 35 mg/dL — ABNORMAL HIGH (ref 8–23)
CO2: 24 mmol/L (ref 22–32)
Calcium: 8.8 mg/dL — ABNORMAL LOW (ref 8.9–10.3)
Chloride: 108 mmol/L (ref 98–111)
Creatinine, Ser: 2.14 mg/dL — ABNORMAL HIGH (ref 0.61–1.24)
GFR calc Af Amer: 33 mL/min — ABNORMAL LOW (ref >60)
GFR calc non Af Amer: 29 mL/min — ABNORMAL LOW (ref >60)
Glucose, Bld: 168 mg/dL — ABNORMAL HIGH (ref 70–99)
Potassium: 4.4 mmol/L (ref 3.5–5.1)
Sodium: 141 mmol/L (ref 135–145)

## 2020-02-05 LAB — CBG MONITORING, ED: Glucose-Capillary: 150 mg/dL — ABNORMAL HIGH (ref 70–99)

## 2020-02-05 LAB — TROPONIN I (HIGH SENSITIVITY)
Troponin I (High Sensitivity): 6 ng/L (ref <18)
Troponin I (High Sensitivity): 6 ng/L (ref <18)

## 2020-02-05 NOTE — ED Notes (Addendum)
EKG done in triage. Seen by Dr Rogene Houston

## 2020-02-05 NOTE — ED Triage Notes (Signed)
Pt to er, pt states that he was at home watching tv and he started having some chest pain and felt like he was going to faint.  States that he checked his sugar in it was 157, states that his chest pain is a little better.  States that he still feels shaky like he may pass out.

## 2020-02-11 ENCOUNTER — Ambulatory Visit (HOSPITAL_COMMUNITY): Payer: Medicare Other

## 2020-02-26 ENCOUNTER — Ambulatory Visit: Payer: Medicare Other | Admitting: Nurse Practitioner

## 2020-03-23 ENCOUNTER — Ambulatory Visit: Payer: Medicare Other | Admitting: Orthopaedic Surgery

## 2020-05-02 ENCOUNTER — Encounter (INDEPENDENT_AMBULATORY_CARE_PROVIDER_SITE_OTHER): Payer: Self-pay | Admitting: *Deleted

## 2020-05-24 ENCOUNTER — Telehealth (INDEPENDENT_AMBULATORY_CARE_PROVIDER_SITE_OTHER): Payer: Self-pay

## 2020-05-24 ENCOUNTER — Other Ambulatory Visit (INDEPENDENT_AMBULATORY_CARE_PROVIDER_SITE_OTHER): Payer: Self-pay

## 2020-05-24 DIAGNOSIS — Z86010 Personal history of colonic polyps: Secondary | ICD-10-CM

## 2020-05-24 NOTE — Telephone Encounter (Addendum)
Referring MD/PCP: Brynda Greathouse   Procedure: tcs w/propofol  Reason/Indication:  Hx of polyps  Has patient had this procedure before?  Yes   If so, when, by whom and where?  2012  Is there a family history of colon cancer?  no  Who?  What age when diagnosed?    Is patient diabetic?   yes      Does patient have prosthetic heart valve or mechanical valve?  no  Do you have a pacemaker/defibrillator?  no  Has patient ever had endocarditis/atrial fibrillation? no  Have you had a stroke/heart attack last 6 mths? nono  Does patient use oxygen? no  Has patient had joint replacement within last 12 months?  no  Is patient constipated or do they take laxatives? yes  Does patient have a history of alcohol/drug use?  no  Is patient on blood thinner such as Coumadin, Plavix and/or Aspirin? no  Medications: Glimepiride 4 mg bid, amlodipine 5 mg bid, allopurinol 100 mg bid, losartan 50 mg daily, lantus 30 units in am & 10 units in om, vit c 500 mg daily, Claritin 10 mg daily, Dulcolax 5 mg bid, vit d 50 mg daily   Allergies: nkda  Medication Adjustment per Dr Rehman/Dr Jenetta Downer hold oral diabetic medication the evening prior and the morning of, half insulin evening before and hold morning of   Procedure date & time: 06/15/20 at 9:00

## 2020-05-25 ENCOUNTER — Encounter (INDEPENDENT_AMBULATORY_CARE_PROVIDER_SITE_OTHER): Payer: Self-pay | Admitting: *Deleted

## 2020-05-25 ENCOUNTER — Telehealth (INDEPENDENT_AMBULATORY_CARE_PROVIDER_SITE_OTHER): Payer: Self-pay

## 2020-05-25 ENCOUNTER — Encounter (INDEPENDENT_AMBULATORY_CARE_PROVIDER_SITE_OTHER): Payer: Self-pay

## 2020-05-25 DIAGNOSIS — Z86010 Personal history of colonic polyps: Secondary | ICD-10-CM

## 2020-05-25 MED ORDER — NA SULFATE-K SULFATE-MG SULF 17.5-3.13-1.6 GM/177ML PO SOLN: 354.0000 mL | Freq: Once | ORAL | 0 refills | Status: AC

## 2020-05-25 NOTE — Telephone Encounter (Signed)
Andre Calderon, CMA  

## 2020-06-09 NOTE — Patient Instructions (Signed)
Andre Calderon  06/09/2020     @PREFPERIOPPHARMACY @   Your procedure is scheduled on 06/15/2020.  Report to [facility] at 7:45 A.M.  Call this number if you have problems the morning of surgery:  [phone]   Remember:  Do not eat or drink after midnight.   Please follow the diet and prep instructions given to you by Dr Olevia Perches office.   Please do not take any diabetic medication the am of surgery.  Please only take 1/2 of your Lantus dosage the night before the procedure.      Take these medicines the morning of surgery with A SIP OF WATER : Norvasc and Claritin.  Please use your Inhalers before coming to the hospital.    Do not wear jewelry, make-up or nail polish.  Do not wear lotions, powders, or perfumes, or deodorant.  Do not shave 48 hours prior to surgery.  Men may shave face and neck.  Do not bring valuables to the hospital.  [facility] is not responsible for any belongings or valuables.  Contacts, dentures or bridgework may not be worn into surgery.  Leave your suitcase in the car.  After surgery it may be brought to your room.  For patients admitted to the hospital, discharge time will be determined by your treatment team.  Patients discharged the day of surgery will not be allowed to drive home.   Name and phone number of your driver:   family Special instructions:  n/a  Please read over the following fact sheets that you were given. Care and Recovery After Surgery     Colonoscopy, Adult A colonoscopy is a procedure to look at the entire large intestine. This procedure is done using a long, thin, flexible tube that has a camera on the end. You may have a colonoscopy:  As a part of normal colorectal screening.  If you have certain symptoms, such as: ? A low number of red blood cells in your blood (anemia). ? Diarrhea that does not go away. ? Pain in your abdomen. ? Blood in your stool. A colonoscopy can help screen for and diagnose medical  problems, including:  Tumors.  Extra tissue that grows where mucus forms (polyps).  Inflammation.  Areas of bleeding. Tell your health care provider about:  Any allergies you have.  All medicines you are taking, including vitamins, herbs, eye drops, creams, and over-the-counter medicines.  Any problems you or family members have had with anesthetic medicines.  Any blood disorders you have.  Any surgeries you have had.  Any medical conditions you have.  Any problems you have had with having bowel movements.  Whether you are pregnant or may be pregnant. What are the risks? Generally, this is a safe procedure. However, problems may occur, including:  Bleeding.  Damage to your intestine.  Allergic reactions to medicines given during the procedure.  Infection. This is rare. What happens before the procedure? Eating and drinking restrictions Follow instructions from your health care provider about eating or drinking restrictions, which may include:  A few days before the procedure: ? Follow a low-fiber diet. ? Avoid nuts, seeds, dried fruit, raw fruits, and vegetables.  1-3 days before the procedure: ? Eat only gelatin dessert or ice pops. ? Drink only clear liquids, such as water, clear juice, clear broth or bouillon, black coffee or tea, or clear soft drinks or sports drinks. ? Avoid liquids that contain red or purple dye.  The day of the procedure: ?  Do not eat solid foods. You may continue to drink clear liquids until up to 2 hours before the procedure. ? Do not eat or drink anything starting 2 hours before the procedure, or within the time period that your health care provider recommends. Bowel prep If you were prescribed a bowel prep to take by mouth (orally) to clean out your colon:  Take it as told by your health care provider. Starting the day before your procedure, you will need to drink a large amount of liquid medicine. The liquid will cause you to have  many bowel movements of loose stool until your stool becomes almost clear or light green.  If your skin or the opening between the buttocks (anus) gets irritated from diarrhea, you may relieve the irritation using: ? Wipes with medicine in them, such as adult wet wipes with aloe and vitamin E. ? A product to soothe skin, such as petroleum jelly.  If you vomit while drinking the bowel prep: ? Take a break for up to 60 minutes. ? Begin the bowel prep again. ? Call your health care provider if you keep vomiting or you cannot take the bowel prep without vomiting.  To clean out your colon, you may also be given: ? Laxative medicines. These help you have a bowel movement. ? Instructions for enema use. An enema is liquid medicine injected into your rectum. Medicines Ask your health care provider about:  Changing or stopping your regular medicines or supplements. This is especially important if you are taking iron supplements, diabetes medicines, or blood thinners.  Taking medicines such as aspirin and ibuprofen. These medicines can thin your blood. Do not take these medicines unless your health care provider tells you to take them.  Taking over-the-counter medicines, vitamins, herbs, and supplements. General instructions  Ask your health care provider what steps will be taken to help prevent infection. These may include washing skin with a germ-killing soap.  Plan to have someone take you home from the hospital or clinic. What happens during the procedure?   An IV will be inserted into one of your veins.  You may be given one or more of the following: ? A medicine to help you relax (sedative). ? A medicine to numb the area (local anesthetic). ? A medicine to make you fall asleep (general anesthetic). This is rarely needed.  You will lie on your side with your knees bent.  The tube will: ? Have oil or gel put on it (be lubricated). ? Be inserted into your anus. ? Be gently eased  through all parts of your large intestine.  Air will be sent into your colon to keep it open. This may cause some pressure or cramping.  Images will be taken with the camera and will appear on a screen.  A small tissue sample may be removed to be looked at under a microscope (biopsy). The tissue may be sent to a lab for testing if any signs of problems are found.  If small polyps are found, they may be removed and checked for cancer cells.  When the procedure is finished, the tube will be removed. The procedure may vary among health care providers and hospitals. What happens after the procedure?  Your blood pressure, heart rate, breathing rate, and blood oxygen level will be monitored until you leave the hospital or clinic.  You may have a small amount of blood in your stool.  You may pass gas and have mild cramping or bloating in  your abdomen. This is caused by the air that was used to open your colon during the exam.  Do not drive for 24 hours after the procedure.  It is up to you to get the results of your procedure. Ask your health care provider, or the department that is doing the procedure, when your results will be ready. Summary  A colonoscopy is a procedure to look at the entire large intestine.  Follow instructions from your health care provider about eating and drinking before the procedure.  If you were prescribed an oral bowel prep to clean out your colon, take it as told by your health care provider.  During the colonoscopy, a flexible tube with a camera on its end is inserted into the anus and then passed into the other parts of the large intestine. This information is not intended to replace advice given to you by your health care provider. Make sure you discuss any questions you have with your health care provider. Document Revised: 12/12/2018 Document Reviewed: 12/12/2018 Elsevier Patient Education  2020 [place] Anesthesia is  a term that refers to techniques, procedures, and medicines that help a person stay safe and comfortable during a medical procedure. Monitored anesthesia care, or sedation, is one type of anesthesia. Your anesthesia specialist may recommend sedation if you will be having a procedure that does not require you to be unconscious, such as:  Cataract surgery.  A dental procedure.  A biopsy.  A colonoscopy. During the procedure, you may receive a medicine to help you relax (sedative). There are three levels of sedation:  Mild sedation. At this level, you may feel awake and relaxed. You will be able to follow directions.  Moderate sedation. At this level, you will be sleepy. You may not remember the procedure.  Deep sedation. At this level, you will be asleep. You will not remember the procedure. The more medicine you are given, the deeper your level of sedation will be. Depending on how you respond to the procedure, the anesthesia specialist may change your level of sedation or the type of anesthesia to fit your needs. An anesthesia specialist will monitor you closely during the procedure. Let your health care provider know about:  Any allergies you have.  All medicines you are taking, including vitamins, herbs, eye drops, creams, and over-the-counter medicines.  Any use of steroids (by mouth or as a cream).  Any problems you or family members have had with sedatives and anesthetic medicines.  Any blood disorders you have.  Any surgeries you have had.  Any medical conditions you have, such as sleep apnea.  Whether you are pregnant or may be pregnant.  Any use of cigarettes, alcohol, or street drugs. What are the risks? Generally, this is a safe procedure. However, problems may occur, including:  Getting too much medicine (oversedation).  Nausea.  Allergic reaction to medicines.  Trouble breathing. If this happens, a breathing tube may be used to help with breathing. It will be  removed when you are awake and breathing on your own.  Heart trouble.  Lung trouble. Before the procedure Staying hydrated Follow instructions from your health care provider about hydration, which may include:  Up to 2 hours before the procedure - you may continue to drink clear liquids, such as water, clear fruit juice, black coffee, and plain tea. Eating and drinking restrictions Follow instructions from your health care provider about eating and drinking, which may include:  8 hours before  the procedure - stop eating heavy meals or foods such as meat, fried foods, or fatty foods.  6 hours before the procedure - stop eating light meals or foods, such as toast or cereal.  6 hours before the procedure - stop drinking milk or drinks that contain milk.  2 hours before the procedure - stop drinking clear liquids. Medicines Ask your health care provider about:  Changing or stopping your regular medicines. This is especially important if you are taking diabetes medicines or blood thinners.  Taking medicines such as aspirin and ibuprofen. These medicines can thin your blood. Do not take these medicines before your procedure if your health care provider instructs you not to. Tests and exams  You will have a physical exam.  You may have blood tests done to show: ? How well your kidneys and liver are working. ? How well your blood can clot. General instructions  Plan to have someone take you home from the hospital or clinic.  If you will be going home right after the procedure, plan to have someone with you for 24 hours.  What happens during the procedure?  Your blood pressure, heart rate, breathing, level of pain and overall condition will be monitored.  An IV tube will be inserted into one of your veins.  Your anesthesia specialist will give you medicines as needed to keep you comfortable during the procedure. This may mean changing the level of sedation.  The procedure will  be performed. After the procedure  Your blood pressure, heart rate, breathing rate, and blood oxygen level will be monitored until the medicines you were given have worn off.  Do not drive for 24 hours if you received a sedative.  You may: ? Feel sleepy, clumsy, or nauseous. ? Feel forgetful about what happened after the procedure. ? Have a sore throat if you had a breathing tube during the procedure. ? Vomit. This information is not intended to replace advice given to you by your health care provider. Make sure you discuss any questions you have with your health care provider. Document Revised: 05/03/2017 Document Reviewed: 09/11/2015 Elsevier Patient Education  2020 [employer].

## 2020-06-13 ENCOUNTER — Encounter (HOSPITAL_COMMUNITY): Payer: Self-pay

## 2020-06-13 ENCOUNTER — Encounter (HOSPITAL_COMMUNITY)
Admission: RE | Admit: 2020-06-13 | Discharge: 2020-06-13 | Disposition: A | Discharge status: Home / Self Care | Payer: Medicare Other | Source: Ambulatory Visit | Attending: Internal Medicine | Admitting: Internal Medicine

## 2020-06-13 ENCOUNTER — Other Ambulatory Visit: Payer: Self-pay

## 2020-06-13 ENCOUNTER — Other Ambulatory Visit (HOSPITAL_COMMUNITY)
Admission: RE | Admit: 2020-06-13 | Discharge: 2020-06-13 | Disposition: A | Discharge status: Home / Self Care | Payer: Medicare Other | Source: Ambulatory Visit | Attending: Internal Medicine | Admitting: Internal Medicine

## 2020-06-13 DIAGNOSIS — Z20822 Contact with and (suspected) exposure to covid-19: Secondary | ICD-10-CM | POA: Insufficient documentation

## 2020-06-13 DIAGNOSIS — Z01812 Encounter for preprocedural laboratory examination: Secondary | ICD-10-CM | POA: Insufficient documentation

## 2020-06-13 LAB — BASIC METABOLIC PANEL
Anion gap: 9 (ref 5–15)
BUN: 31 mg/dL — ABNORMAL HIGH (ref 8–23)
CO2: 23 mmol/L (ref 22–32)
Calcium: 9.2 mg/dL (ref 8.9–10.3)
Chloride: 109 mmol/L (ref 98–111)
Creatinine, Ser: 2.06 mg/dL — ABNORMAL HIGH (ref 0.61–1.24)
GFR, Estimated: 32 mL/min — ABNORMAL LOW (ref >60)
Glucose, Bld: 125 mg/dL — ABNORMAL HIGH (ref 70–99)
Potassium: 4 mmol/L (ref 3.5–5.1)
Sodium: 141 mmol/L (ref 135–145)

## 2020-06-13 LAB — SARS CORONAVIRUS 2 (TAT 6-24 HRS): SARS Coronavirus 2: NEGATIVE

## 2020-06-15 ENCOUNTER — Encounter (HOSPITAL_COMMUNITY)
Admission: RE | Disposition: A | Discharge status: Home / Self Care | Payer: Self-pay | Source: Home / Self Care | Attending: Internal Medicine

## 2020-06-15 ENCOUNTER — Ambulatory Visit (HOSPITAL_COMMUNITY)
Admission: RE | Admit: 2020-06-15 | Discharge: 2020-06-15 | Disposition: A | Discharge status: Home / Self Care | Payer: Medicare Other | Attending: Internal Medicine | Admitting: Internal Medicine

## 2020-06-15 ENCOUNTER — Other Ambulatory Visit: Payer: Self-pay

## 2020-06-15 ENCOUNTER — Ambulatory Visit (HOSPITAL_COMMUNITY): Payer: Medicare Other | Admitting: Anesthesiology

## 2020-06-15 DIAGNOSIS — D123 Benign neoplasm of transverse colon: Secondary | ICD-10-CM

## 2020-06-15 DIAGNOSIS — Z888 Allergy status to other drugs, medicaments and biological substances status: Secondary | ICD-10-CM | POA: Insufficient documentation

## 2020-06-15 DIAGNOSIS — Z887 Allergy status to serum and vaccine status: Secondary | ICD-10-CM | POA: Insufficient documentation

## 2020-06-15 DIAGNOSIS — Z79899 Other long term (current) drug therapy: Secondary | ICD-10-CM | POA: Diagnosis not present

## 2020-06-15 DIAGNOSIS — Z7952 Long term (current) use of systemic steroids: Secondary | ICD-10-CM | POA: Insufficient documentation

## 2020-06-15 DIAGNOSIS — Z87891 Personal history of nicotine dependence: Secondary | ICD-10-CM | POA: Insufficient documentation

## 2020-06-15 DIAGNOSIS — K5730 Diverticulosis of large intestine without perforation or abscess without bleeding: Secondary | ICD-10-CM

## 2020-06-15 DIAGNOSIS — Z886 Allergy status to analgesic agent status: Secondary | ICD-10-CM | POA: Diagnosis not present

## 2020-06-15 DIAGNOSIS — K644 Residual hemorrhoidal skin tags: Secondary | ICD-10-CM

## 2020-06-15 DIAGNOSIS — Z86010 Personal history of colonic polyps: Secondary | ICD-10-CM | POA: Diagnosis not present

## 2020-06-15 DIAGNOSIS — K640 First degree hemorrhoids: Secondary | ICD-10-CM | POA: Insufficient documentation

## 2020-06-15 DIAGNOSIS — Z09 Encounter for follow-up examination after completed treatment for conditions other than malignant neoplasm: Secondary | ICD-10-CM

## 2020-06-15 DIAGNOSIS — E119 Type 2 diabetes mellitus without complications: Secondary | ICD-10-CM | POA: Diagnosis not present

## 2020-06-15 DIAGNOSIS — Z794 Long term (current) use of insulin: Secondary | ICD-10-CM | POA: Insufficient documentation

## 2020-06-15 DIAGNOSIS — Z1211 Encounter for screening for malignant neoplasm of colon: Secondary | ICD-10-CM | POA: Insufficient documentation

## 2020-06-15 DIAGNOSIS — D120 Benign neoplasm of cecum: Secondary | ICD-10-CM

## 2020-06-15 HISTORY — PX: POLYPECTOMY: SHX5525

## 2020-06-15 HISTORY — PX: COLONOSCOPY WITH PROPOFOL: SHX5780

## 2020-06-15 LAB — GLUCOSE, CAPILLARY
Glucose-Capillary: 191 mg/dL — ABNORMAL HIGH (ref 70–99)
Glucose-Capillary: 67 mg/dL — ABNORMAL LOW (ref 70–99)
Glucose-Capillary: 125 mg/dL — ABNORMAL HIGH (ref 70–99)

## 2020-06-15 SURGERY — COLONOSCOPY WITH PROPOFOL
Anesthesia: General

## 2020-06-15 MED ORDER — CHLORHEXIDINE GLUCONATE CLOTH 2 % EX PADS: 6.0000 | MEDICATED_PAD | Freq: Once | CUTANEOUS | Status: DC

## 2020-06-15 MED ORDER — DEXTROSE 50 % IV SOLN
INTRAVENOUS | Status: AC
  Filled 2020-06-15: qty 50

## 2020-06-15 MED ORDER — PROPOFOL 500 MG/50ML IV EMUL
INTRAVENOUS | Status: DC | PRN
  Administered 2020-06-15: 150 ug/kg/min via INTRAVENOUS

## 2020-06-15 MED ORDER — SIMETHICONE 40 MG/0.6ML PO SUSP
ORAL | Status: DC | PRN
  Administered 2020-06-15: 200 mL via ORAL

## 2020-06-15 MED ORDER — LACTATED RINGERS IV SOLN: Freq: Once | INTRAVENOUS | Status: AC

## 2020-06-15 MED ORDER — DEXTROSE 50 % IV SOLN
1.0000 | Freq: Once | INTRAVENOUS | Status: AC
  Administered 2020-06-15: 50 mL via INTRAVENOUS

## 2020-06-15 MED ORDER — LIDOCAINE HCL (CARDIAC) PF 100 MG/5ML IV SOSY
PREFILLED_SYRINGE | INTRAVENOUS | Status: DC | PRN
  Administered 2020-06-15: 50 mg via INTRAVENOUS

## 2020-06-15 MED ORDER — PROPOFOL 10 MG/ML IV BOLUS
INTRAVENOUS | Status: DC | PRN
  Administered 2020-06-15: 100 mg via INTRAVENOUS

## 2020-06-15 NOTE — Op Note (Signed)
[facility] Patient Name: Andre Calderon Procedure Date: 06/15/2020 8:37 AM MRN: [id number] Date of Birth: December 24, 1940 Attending MD: Hildred Laser , MD CSN: [id number] Age: 80 Admit Type: Outpatient Procedure:                Colonoscopy Indications:              High risk colon cancer surveillance: Personal                            history of colonic polyps Providers:                Hildred Laser, MD, Lurline Del, RN, Casimer Bilis, Technician Referring MD:             Eber Hong, MD Medicines:                Propofol per Anesthesia Complications:            No immediate complications. Estimated Blood Loss:     Estimated blood loss was minimal. Procedure:                Pre-Anesthesia Assessment:                           - Prior to the procedure, a History and Physical                            was performed, and patient medications and                            allergies were reviewed. The patient's tolerance of                            previous anesthesia was also reviewed. The risks                            and benefits of the procedure and the sedation                            options and risks were discussed with the patient.                            All questions were answered, and informed consent                            was obtained. Prior Anticoagulants: The patient has                            taken no previous anticoagulant or antiplatelet                            agents. ASA Grade Assessment: III - A patient with  severe systemic disease. After reviewing the risks                            and benefits, the patient was deemed in                            satisfactory condition to undergo the procedure.                           After obtaining informed consent, the colonoscope                            was passed under direct vision. Throughout the                            procedure,  the patient's blood pressure, pulse, and                            oxygen saturations were monitored continuously. The                            PCF-H190DL ([id number]) scope was introduced through                            the anus and advanced to the the cecum, identified                            by appendiceal orifice and ileocecal valve. The                            colonoscopy was performed without difficulty. The                            patient tolerated the procedure well. The quality                            of the bowel preparation was good. The ileocecal                            valve, appendiceal orifice, and rectum were                            photographed. Scope In: 9:08:20 AM Scope Out: 9:32:04 AM Scope Withdrawal Time: 0 hours 18 minutes 58 seconds  Total Procedure Duration: 0 hours 23 minutes 44 seconds  Findings:      The perianal and digital rectal examinations were normal.      Two polyps were found in the hepatic flexure and cecum. The polyps were       3 to 6 mm in size. These polyps were removed with a cold snare.       Resection and retrieval were complete. The pathology specimen was placed       into Bottle Number 1.      A 8 mm polyp was found in the proximal transverse colon. The polyp was  semi-pedunculated. The polyp was removed with a cold snare. Resection       and retrieval were complete. To stop active bleeding, one hemostatic       clip was successfully placed (MR conditional). There was no bleeding at       the end of the procedure. The pathology specimen was placed into Bottle       Number 1.      A single small-mouthed diverticulum was found in the sigmoid colon.      External hemorrhoids were found during retroflexion. The hemorrhoids       were small. Impression:               - Two 3 to 6 mm polyps at the hepatic flexure and                            in the cecum, removed with a cold snare. Resected                             and retrieved.                           - One 8 mm polyp in the proximal transverse colon,                            removed with a cold snare. Resected and retrieved.                            Clip (MR conditional) was placed.                           - Diverticulosis in the sigmoid colon.                           - External hemorrhoids. Moderate Sedation:      Per Anesthesia Care Recommendation:           - Patient has a contact number available for                            emergencies. The signs and symptoms of potential                            delayed complications were discussed with the                            patient. Return to normal activities tomorrow.                            Written discharge instructions were provided to the                            patient.                           - Resume previous diet today.                           -  Continue present medications.                           - No aspirin, ibuprofen, naproxen, or other                            non-steroidal anti-inflammatory drugs for 3 days.                           - Await pathology results.                           - Repeat colonoscopy is recommended. The                            colonoscopy date will be determined after pathology                            results from today's exam become available for                            review. Procedure Code(s):        --- Professional ---                           [id number], Colonoscopy, flexible; with removal of                            tumor(s), polyp(s), or other lesion(s) by snare                            technique Diagnosis Code(s):        --- Professional ---                           K63.5, Polyp of colon                           Z86.010, Personal history of colonic polyps                           K64.0, First degree hemorrhoids                           K64.4, Residual hemorrhoidal skin tags                            K57.30, Diverticulosis of large intestine without                            perforation or abscess without bleeding CPT copyright 2019 American [employer] Association. All rights reserved. The codes documented in this report are preliminary and upon coder review may  be revised to meet current compliance requirements. Hildred Laser, MD Hildred Laser, MD 06/15/2020 9:42:38 AM This report has been signed electronically. Number of Addenda: 0

## 2020-06-15 NOTE — Discharge Instructions (Signed)
No aspirin or NSAIDs 3 days Resume usual medications and diet as before No driving for 24 hours Physician will call with biopsy results. Remember you cannot have an MRI until clip has passed.     Colonoscopy, Adult, Care After This sheet gives you information about how to care for yourself after your procedure. Your doctor may also give you more specific instructions. If you have problems or questions, call your doctor. What can I expect after the procedure? After the procedure, it is common to have:  A small amount of blood in your poop (stool) for 24 hours.  Some gas.  Mild cramping or bloating in your belly (abdomen). Follow these instructions at home: Eating and drinking  Drink enough fluid to keep your pee (urine) pale yellow.  Follow instructions from your doctor about what you cannot eat or drink.  Return to your normal diet as told by your doctor. Avoid heavy or fried foods that are hard to digest.   Activity  Rest as told by your doctor.  Do not sit for a long time without moving. Get up to take short walks every 1-2 hours. This is important. Ask for help if you feel weak or unsteady.  Return to your normal activities as told by your doctor. Ask your doctor what activities are safe for you. To help cramping and bloating:  Try walking around.  Put heat on your belly as told by your doctor. Use the heat source that your doctor recommends, such as a moist heat pack or a heating pad. ? Put a towel between your skin and the heat source. ? Leave the heat on for 20-30 minutes. ? Remove the heat if your skin turns bright red. This is very important if you are unable to feel pain, heat, or cold. You may have a greater risk of getting burned.   General instructions  If you were given a medicine to help you relax (sedative) during your procedure, it can affect you for many hours. Do not drive or use machinery until your doctor says that it is safe.  For the first 24 hours  after the procedure: ? Do not sign important documents. ? Do not drink alcohol. ? Do your daily activities more slowly than normal. ? Eat foods that are soft and easy to digest.  Take over-the-counter or prescription medicines only as told by your doctor.  Keep all follow-up visits as told by your doctor. This is important. Contact a doctor if:  You have blood in your poop 2-3 days after the procedure. Get help right away if:  You have more than a small amount of blood in your poop.  You see large clumps of tissue (blood clots) in your poop.  Your belly is swollen.  You feel like you may vomit (nauseous).  You vomit.  You have a fever.  You have belly pain that gets worse, and medicine does not help your pain. Summary  After the procedure, it is common to have a small amount of blood in your poop. You may also have mild cramping and bloating in your belly.  If you were given a medicine to help you relax (sedative) during your procedure, it can affect you for many hours. Do not drive or use machinery until your doctor says that it is safe.  Get help right away if you have a lot of blood in your poop, feel like you may vomit, have a fever, or have more belly pain. This information  is not intended to replace advice given to you by your health care provider. Make sure you discuss any questions you have with your health care provider. Document Revised: 03/27/2019 Document Reviewed: 12/15/2018 Elsevier Patient Education  [place].     Colon Polyps  Colon polyps are tissue growths inside the colon, which is part of the large intestine. They are one of the types of polyps that can grow in the body. A polyp may be a round bump or a mushroom-shaped growth. You could have one polyp or more than one. Most colon polyps are noncancerous (benign). However, some colon polyps can become cancerous over time. Finding and removing the polyps early can help prevent this. What are  the causes? The exact cause of colon polyps is not known. What increases the risk? The following factors may make you more likely to develop this condition:  Having a family history of colorectal cancer or colon polyps.  Being older than 79 years of age.  Being younger than 79 years of age and having a significant family history of colorectal cancer or colon polyps or a genetic condition that puts you at higher risk of getting colon polyps.  Having inflammatory bowel disease, such as ulcerative colitis or Crohn's disease.  Having certain conditions passed from parent to child (hereditary conditions), such as: ? Familial adenomatous polyposis (FAP). ? Lynch syndrome. ? Turcot syndrome. ? Peutz-Jeghers syndrome. ? MUTYH-associated polyposis (MAP).  Being overweight.  Certain lifestyle factors. These include smoking cigarettes, drinking too much alcohol, not getting enough exercise, and eating a diet that is high in fat and red meat and low in fiber.  Having had childhood cancer that was treated with radiation of the abdomen. What are the signs or symptoms? Many times, there are no symptoms. If you have symptoms, they may include:  Blood coming from the rectum during a bowel movement.  Blood in the stool (feces). The blood may be bright red or very dark in color.  Pain in the abdomen.  A change in bowel habits, such as constipation or diarrhea. How is this diagnosed? This condition is diagnosed with a colonoscopy. This is a procedure in which a lighted, flexible scope is inserted into the opening between the buttocks (anus) and then passed into the colon to examine the area. Polyps are sometimes found when a colonoscopy is done as part of routine cancer screening tests. How is this treated? This condition is treated by removing any polyps that are found. Most polyps can be removed during a colonoscopy. Those polyps will then be tested for cancer. Additional treatment may be needed  depending on the results of testing. Follow these instructions at home: Eating and drinking  Eat foods that are high in fiber, such as fruits, vegetables, and whole grains.  Eat foods that are high in calcium and vitamin D, such as milk, cheese, yogurt, eggs, liver, fish, and broccoli.  Limit foods that are high in fat, such as fried foods and desserts.  Limit the amount of red meat, precooked or cured meat, or other processed meat that you eat, such as hot dogs, sausages, bacon, or meat loaves.  Limit sugary drinks.   Lifestyle  Maintain a healthy weight, or lose weight if recommended by your health care provider.  Exercise every day or as told by your health care provider.  Do not use any products that contain nicotine or tobacco, such as cigarettes, e-cigarettes, and chewing tobacco. If you need help quitting, ask your  health care provider.  Do not drink alcohol if: ? Your health care provider tells you not to drink. ? You are pregnant, may be pregnant, or are planning to become pregnant.  If you drink alcohol: ? Limit how much you use to:  0-1 drink a day for women.  0-2 drinks a day for men. ? Know how much alcohol is in your drink. In the [place] one drink equals one 12 oz bottle of beer (355 mL), one 5 oz glass of wine (148 mL), or one 1 oz glass of hard liquor (44 mL). General instructions  Take over-the-counter and prescription medicines only as told by your health care provider.  Keep all follow-up visits. This is important. This includes having regularly scheduled colonoscopies. Talk to your health care provider about when you need a colonoscopy. Contact a health care provider if:  You have new or worsening bleeding during a bowel movement.  You have new or increased blood in your stool.  You have a change in bowel habits.  You lose weight for no known reason. Summary  Colon polyps are tissue growths inside the colon, which is part of the large intestine.  They are one type of polyp that can grow in the body.  Most colon polyps are noncancerous (benign), but some can become cancerous over time.  This condition is diagnosed with a colonoscopy.  This condition is treated by removing any polyps that are found. Most polyps can be removed during a colonoscopy. This information is not intended to replace advice given to you by your health care provider. Make sure you discuss any questions you have with your health care provider. Document Revised: 09/09/2019 Document Reviewed: 09/09/2019 Elsevier Patient Education  2021 [place] After This sheet gives you information about how to care for yourself after your procedure. Your health care provider may also give you more specific instructions. If you have problems or questions, contact your health care provider. What can I expect after the procedure? After the procedure, it is common to have:  Tiredness.  Forgetfulness about what happened after the procedure.  Impaired judgment for important decisions.  Nausea or vomiting.  Some difficulty with balance. Follow these instructions at home: For the time period you were told by your health care provider:  Rest as needed.  Do not participate in activities where you could fall or become injured.  Do not drive or use machinery.  Do not drink alcohol.  Do not take sleeping pills or medicines that cause drowsiness.  Do not make important decisions or sign legal documents.  Do not take care of children on your own.      Eating and drinking  Follow the diet that is recommended by your health care provider.  Drink enough fluid to keep your urine pale yellow.  If you vomit: ? Drink water, juice, or soup when you can drink without vomiting. ? Make sure you have little or no nausea before eating solid foods. General instructions  Have a responsible adult stay with you for the time you are told. It  is important to have someone help care for you until you are awake and alert.  Take over-the-counter and prescription medicines only as told by your health care provider.  If you have sleep apnea, surgery and certain medicines can increase your risk for breathing problems. Follow instructions from your health care provider about wearing your sleep device: ? Anytime you are sleeping, including  during daytime naps. ? While taking prescription pain medicines, sleeping medicines, or medicines that make you drowsy.  Avoid smoking.  Keep all follow-up visits as told by your health care provider. This is important. Contact a health care provider if:  You keep feeling nauseous or you keep vomiting.  You feel light-headed.  You are still sleepy or having trouble with balance after 24 hours.  You develop a rash.  You have a fever.  You have redness or swelling around the IV site. Get help right away if:  You have trouble breathing.  You have new-onset confusion at home. Summary  For several hours after your procedure, you may feel tired. You may also be forgetful and have poor judgment.  Have a responsible adult stay with you for the time you are told. It is important to have someone help care for you until you are awake and alert.  Rest as told. Do not drive or operate machinery. Do not drink alcohol or take sleeping pills.  Get help right away if you have trouble breathing, or if you suddenly become confused. This information is not intended to replace advice given to you by your health care provider. Make sure you discuss any questions you have with your health care provider. Document Revised: 02/04/2020 Document Reviewed: 04/23/2019 Elsevier Patient Education  2021 [employer].

## 2020-06-15 NOTE — Anesthesia Postprocedure Evaluation (Signed)
Anesthesia Post Note  Patient: Andre Calderon  Procedure(s) Performed: COLONOSCOPY WITH PROPOFOL (N/A ) POLYPECTOMY  Patient location during evaluation: PACU Anesthesia Type: General Level of consciousness: awake and alert and oriented Pain management: pain level controlled Vital Signs Assessment: post-procedure vital signs reviewed and stable Respiratory status: spontaneous breathing, nonlabored ventilation and respiratory function stable Cardiovascular status: blood pressure returned to baseline and stable Postop Assessment: no apparent nausea or vomiting Anesthetic complications: no   No complications documented.   Last Vitals:  Vitals:   06/15/20 0945 06/15/20 1002  BP: 119/89 (!) 141/89  Pulse:  72  Resp: 14 16  Temp:  36.6 C  SpO2: 98%     Last Pain:  Vitals:   06/15/20 1002  TempSrc: Axillary  PainSc: 0-No pain                 Orlie Dakin

## 2020-06-15 NOTE — Anesthesia Procedure Notes (Signed)
Date/Time: 06/15/2020 9:08 AM Performed by: Orlie Dakin, CRNA Pre-anesthesia Checklist: Patient identified, Emergency Drugs available, Suction available and Patient being monitored Patient Re-evaluated:Patient Re-evaluated prior to induction Oxygen Delivery Method: Nasal cannula Induction Type: IV induction Placement Confirmation: positive ETCO2

## 2020-06-15 NOTE — Transfer of Care (Signed)
Immediate Anesthesia Transfer of Care Note  Patient: Andre Calderon  Procedure(s) Performed: COLONOSCOPY WITH PROPOFOL (N/A ) POLYPECTOMY  Patient Location: PACU  Anesthesia Type:General  Level of Consciousness: drowsy  Airway & Oxygen Therapy: Patient Spontanous Breathing and Patient connected to nasal cannula oxygen  Post-op Assessment: Report given to RN and Post -op Vital signs reviewed and stable  Post vital signs: Reviewed and stable  Last Vitals:  Vitals Value Taken Time  BP    Temp    Pulse    Resp 24 06/15/20 0936  SpO2    Vitals shown include unvalidated device data.  Last Pain:  Vitals:   06/15/20 0752  TempSrc: Oral      Patients Stated Pain Goal: 6 (74/14/23 9532)  Complications: No complications documented.

## 2020-06-15 NOTE — Anesthesia Preprocedure Evaluation (Signed)
Anesthesia Evaluation  Patient identified by MRN, date of birth, ID band Patient awake    Reviewed: Allergy & Precautions, NPO status , Patient's Chart, lab work & pertinent test results  History of Anesthesia Complications Negative for: history of anesthetic complications  Airway Mallampati: II  TM Distance: >3 FB Neck ROM: Full    Dental  (+) Dental Advisory Given, Missing, Edentulous Upper   Pulmonary former smoker,    Pulmonary exam normal breath sounds clear to auscultation       Cardiovascular Exercise Tolerance: Good hypertension, Pt. 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, 1 amp D50 was given), Well Controlled, Type 2, Oral Hypoglycemic Agents, Insulin Dependent  Renal/GU Renal InsufficiencyRenal disease (Creatinine 2)     Musculoskeletal negative musculoskeletal ROS (+)   Abdominal   Peds  Hematology negative hematology ROS (+)   Anesthesia Other Findings   Reproductive/Obstetrics negative OB ROS                            Anesthesia Physical Anesthesia Plan  ASA: III  Anesthesia Plan: General   Post-op Pain Management:    Induction: Intravenous  PONV Risk Score and Plan: TIVA  Airway Management Planned: Nasal Cannula and Natural Airway  Additional Equipment:   Intra-op Plan:   Post-operative Plan:   Informed Consent: I have reviewed the patients History and Physical, chart, labs and discussed the procedure including the risks, benefits and alternatives for the proposed anesthesia with the patient or authorized representative who has indicated his/her understanding and acceptance.     Dental advisory given  Plan Discussed with: CRNA and Surgeon  Anesthesia Plan Comments:          Anesthesia Quick Evaluation

## 2020-06-15 NOTE — H&P (Signed)
Andre Calderon is an 80 y.o. male.   Chief Complaint: Patient is here for colonoscopy. HPI: Patient is 79 year old African-American male who has a history of colonic adenomas and is here for surveillance colonoscopy.  While no polyps were found on his colonoscopy of 2012 he did have adenomas on prior exam.  He has not had any exam since his last exam of 2012.  He denies abdominal pain change in bowel habits or rectal bleeding.  His appetite is good.  He has not noted any change in abdominal girth. He does not take aspirin or anticoagulants. Family history is negative for CRC    Past Medical History:  Diagnosis Date  . Diabetes mellitus without complication ([place])   . Hypertension     Past Surgical History:  Procedure Laterality Date  . CHOLECYSTECTOMY      No family history on file. Social History:  reports that he has quit smoking. He has never used smokeless tobacco. He reports that he does not drink alcohol and does not use drugs.  Allergies:  Allergies  Allergen Reactions  . Ibuprofen     Kidney Damage   . Lisinopril     Cough and worsening renal function  . Pioglitazone     Sugar too low   . Simvastatin     Rash, muscle pain    Medications Prior to Admission  Medication Sig Dispense Refill  . acetaminophen (TYLENOL) 500 MG tablet Take 1,000 mg by mouth every 6 (six) hours as needed for moderate pain or headache.    . allopurinol (ZYLOPRIM) 100 MG tablet Take 100 mg by mouth 2 (two) times daily.     Marland Kitchen amLODipine (NORVASC) 5 MG tablet Take 5 mg by mouth 2 (two) times daily.     . Azelastine HCl 137 MCG/SPRAY SOLN Place 2 sprays into both nostrils 2 (two) times daily as needed (congestion).    Marland Kitchen azithromycin (ZITHROMAX) 250 MG tablet Take 250-500 mg by mouth See admin instructions. Take 500 mg on day 1 then take 250 mg daily for 4 days    . benzonatate (TESSALON) 100 MG capsule Take 200 mg by mouth 3 (three) times daily as needed for cough.    . bisacodyl (DULCOLAX) 5  MG EC tablet Take 5 mg by mouth 2 (two) times daily.    . Calcipotriene 0.005 % solution Apply 1 application topically daily.    . Cholecalciferol (VITAMIN D) 50 MCG (2000 UT) tablet Take 2,000 Units by mouth daily.    Marland Kitchen glimepiride (AMARYL) 4 MG tablet Take 4 mg by mouth 2 (two) times daily.    . insulin glargine (LANTUS) 100 UNIT/ML injection Inject 15-30 Units into the skin See admin instructions. Inject 30 units in the morning and 15 units at night    . ketoconazole (NIZORAL) 2 % shampoo Apply 1 application topically 2 (two) times a week.    . loratadine (CLARITIN) 10 MG tablet Take 10 mg by mouth daily.    Marland Kitchen losartan (COZAAR) 100 MG tablet Take 50 mg by mouth daily.    . predniSONE (DELTASONE) 10 MG tablet Take 10-40 mg by mouth See admin instructions. Take 40 mg on the first day, then 30 mg for 2 days, then 20 mg for 2 days, then 10 mg for 2 days    . triamcinolone ointment (KENALOG) 0.1 % Apply 1 application topically daily.    . vitamin C (ASCORBIC ACID) 500 MG tablet Take 500 mg by mouth daily.    Marland Kitchen  albuterol (VENTOLIN HFA) 108 (90 Base) MCG/ACT inhaler Inhale 2 puffs into the lungs every 6 (six) hours as needed for wheezing or shortness of breath.    . Insulin Pen Needle (NOVOFINE) 32G X 6 MM MISC Inject into the skin.    . Lancets (ONETOUCH DELICA PLUS [id number]) MISC Apply 1 each topically daily.    Glory Rosebush VERIO test strip 1 each daily.      Results for orders placed or performed during the hospital encounter of 06/15/20 (from the past 48 hour(s))  Glucose, capillary     Status: Abnormal   Collection Time: 06/15/20  7:56 AM  Result Value Ref Range   Glucose-Capillary 67 (L) 70 - 99 mg/dL    Comment: Glucose reference range applies only to samples taken after fasting for at least 8 hours.  Glucose, capillary     Status: Abnormal   Collection Time: 06/15/20  8:25 AM  Result Value Ref Range   Glucose-Capillary 191 (H) 70 - 99 mg/dL    Comment: Glucose reference range applies  only to samples taken after fasting for at least 8 hours.   No results found.  Review of Systems  Blood pressure 132/71, temperature 98.5 F (36.9 C), temperature source Oral, resp. rate 20, height 5\' 10"  (1.778 m), weight 95.3 kg, SpO2 96 %. Physical Exam HENT:     Mouth/Throat:     Mouth: Mucous membranes are moist.     Pharynx: Oropharynx is clear.  Eyes:     General: No scleral icterus.    Conjunctiva/sclera: Conjunctivae normal.  Cardiovascular:     Rate and Rhythm: Normal rate and regular rhythm.     Heart sounds: Normal heart sounds. No murmur heard.   Pulmonary:     Effort: Pulmonary effort is normal.     Breath sounds: Normal breath sounds.  Abdominal:     Comments: Abdomen is protuberant but soft and nontender without organomegaly or masses.  Musculoskeletal:        General: No swelling.     Cervical back: Neck supple.  Lymphadenopathy:     Cervical: No cervical adenopathy.  Skin:    General: Skin is warm and dry.  Neurological:     Mental Status: He is alert.      Assessment/Plan  History of colonic adenomas Surveillance colonoscopy.  Hildred Laser, MD 06/15/2020, 8:59 AM

## 2020-06-16 LAB — SURGICAL PATHOLOGY

## 2020-06-17 ENCOUNTER — Encounter (HOSPITAL_COMMUNITY): Payer: Self-pay | Admitting: Internal Medicine

## 2021-06-05 ENCOUNTER — Other Ambulatory Visit: Payer: Self-pay

## 2021-06-05 ENCOUNTER — Emergency Department (HOSPITAL_COMMUNITY): Payer: Medicare Other

## 2021-06-05 ENCOUNTER — Emergency Department (HOSPITAL_COMMUNITY)
Admission: EM | Admit: 2021-06-05 | Discharge: 2021-06-06 | Disposition: A | Discharge status: Home / Self Care | Payer: Medicare Other | Attending: Emergency Medicine | Admitting: Emergency Medicine

## 2021-06-05 ENCOUNTER — Encounter (HOSPITAL_COMMUNITY): Payer: Self-pay

## 2021-06-05 DIAGNOSIS — Z794 Long term (current) use of insulin: Secondary | ICD-10-CM | POA: Insufficient documentation

## 2021-06-05 DIAGNOSIS — Z79899 Other long term (current) drug therapy: Secondary | ICD-10-CM | POA: Insufficient documentation

## 2021-06-05 DIAGNOSIS — N189 Chronic kidney disease, unspecified: Secondary | ICD-10-CM | POA: Insufficient documentation

## 2021-06-05 DIAGNOSIS — Z7984 Long term (current) use of oral hypoglycemic drugs: Secondary | ICD-10-CM | POA: Insufficient documentation

## 2021-06-05 DIAGNOSIS — K3184 Gastroparesis: Secondary | ICD-10-CM | POA: Diagnosis not present

## 2021-06-05 DIAGNOSIS — E1122 Type 2 diabetes mellitus with diabetic chronic kidney disease: Secondary | ICD-10-CM | POA: Insufficient documentation

## 2021-06-05 DIAGNOSIS — E1143 Type 2 diabetes mellitus with diabetic autonomic (poly)neuropathy: Secondary | ICD-10-CM | POA: Diagnosis not present

## 2021-06-05 DIAGNOSIS — K5900 Constipation, unspecified: Secondary | ICD-10-CM | POA: Diagnosis present

## 2021-06-05 LAB — CBC WITH DIFFERENTIAL/PLATELET
Abs Immature Granulocytes: 0.02 10*3/uL (ref 0.00–0.07)
Basophils Absolute: 0 10*3/uL (ref 0.0–0.1)
Basophils Relative: 1 %
Eosinophils Absolute: 0.3 10*3/uL (ref 0.0–0.5)
Eosinophils Relative: 4 %
HCT: 38.7 % — ABNORMAL LOW (ref 39.0–52.0)
Hemoglobin: 12.9 g/dL — ABNORMAL LOW (ref 13.0–17.0)
Immature Granulocytes: 0 %
Lymphocytes Relative: 29 %
Lymphs Abs: 2.3 10*3/uL (ref 0.7–4.0)
MCH: 29 pg (ref 26.0–34.0)
MCHC: 33.3 g/dL (ref 30.0–36.0)
MCV: 87 fL (ref 80.0–100.0)
Monocytes Absolute: 1 10*3/uL (ref 0.1–1.0)
Monocytes Relative: 12 %
Neutro Abs: 4.3 10*3/uL (ref 1.7–7.7)
Neutrophils Relative %: 54 %
Platelets: 263 10*3/uL (ref 150–400)
RBC: 4.45 MIL/uL (ref 4.22–5.81)
RDW: 15.7 % — ABNORMAL HIGH (ref 11.5–15.5)
WBC: 8 10*3/uL (ref 4.0–10.5)
nRBC: 0 % (ref 0.0–0.2)

## 2021-06-05 LAB — COMPREHENSIVE METABOLIC PANEL
ALT: 27 U/L (ref 0–44)
AST: 23 U/L (ref 15–41)
Albumin: 3.6 g/dL (ref 3.5–5.0)
Alkaline Phosphatase: 67 U/L (ref 38–126)
Anion gap: 7 (ref 5–15)
BUN: 35 mg/dL — ABNORMAL HIGH (ref 8–23)
CO2: 26 mmol/L (ref 22–32)
Calcium: 9.1 mg/dL (ref 8.9–10.3)
Chloride: 106 mmol/L (ref 98–111)
Creatinine, Ser: 2.6 mg/dL — ABNORMAL HIGH (ref 0.61–1.24)
GFR, Estimated: 24 mL/min — ABNORMAL LOW (ref >60)
Glucose, Bld: 109 mg/dL — ABNORMAL HIGH (ref 70–99)
Potassium: 4.3 mmol/L (ref 3.5–5.1)
Sodium: 139 mmol/L (ref 135–145)
Total Bilirubin: 0.3 mg/dL (ref 0.3–1.2)
Total Protein: 8.1 g/dL (ref 6.5–8.1)

## 2021-06-05 LAB — LIPASE, BLOOD: Lipase: 90 U/L — ABNORMAL HIGH (ref 11–51)

## 2021-06-05 NOTE — ED Provider Notes (Signed)
°  Provider Note MRN:  [id number]  Arrival date & time: 06/06/21    ED Course and Medical Decision Making  Assumed care from Dr. Karle Starch at shift change.  Constipation, attempting enema.  CT imaging recommended but patient does not want to travel to [place].  On reassessment patient is requesting discharge.  Minimal success with enema.  Labs are reassuring, vital signs are normal, abdomen is mild to moderately distended but there is no tenderness, no rebound guarding or rigidity.  Patient still does not want to travel to [facility] for CT imaging, wants to go home and try to sleep and reassess the situation in the morning.  Overall this seems like an appropriate alternate plan, patient will continue to try stool softeners at home and possibly return for CT imaging in the morning.  Procedures  Final Clinical Impressions(s) / ED Diagnoses     ICD-10-CM   1. Constipation, unspecified constipation type  K59.00       ED Discharge Orders     None         Discharge Instructions      You were evaluated in the Emergency Department and after careful evaluation, we did not find any emergent condition requiring admission or further testing in the hospital.  Your exam/testing today is overall reassuring.  Continue stool softeners and/or enemas at home.  We discussed management options and plan is to return to the emergency department tomorrow for CT imaging if you do not have success at home tomorrow morning.  Please return to the Emergency Department if you experience any worsening of your condition.   Thank you for allowing [place] to be a part of your care.      Barth Kirks. [place]onia Small, [place] [email or web address]    Maudie Flakes, MD 06/06/21 [phone]

## 2021-06-05 NOTE — ED Triage Notes (Signed)
Constipation x 3 days, has tried dulcolax pills and suppository, prune juice, milk of magnesia, and fleets enema without success. [facility] this [facility] but did not have xray.

## 2021-06-06 NOTE — ED Provider Notes (Signed)
[facility] EMERGENCY DEPARTMENT Provider Note   CSN: [id number] Arrival date & time: 06/05/21  1941     History  Chief Complaint  Patient presents with   Constipation    Andre Calderon is a 81 y.o. male presenting for constipation.  Patient states he has had constipation for 4 days.  He is passing a very minimal amount of gas.  He has had constipation before, but never to this degree.  He has tried Dulcolax, prune juice, milk of magnesia, and enema without improvement.  Previous history of gallbladder surgery, no other abdominal surgeries.  He has had increased cheese intake recently, otherwise no recent medication changes.  No fevers or chills.  No nausea or vomiting.  No pain after p.o. intake.  No urinary symptoms  HPI     Home Medications Prior to Admission medications   Medication Sig Start Date End Date Taking? Authorizing Provider  acetaminophen (TYLENOL) 500 MG tablet Take 1,000 mg by mouth every 6 (six) hours as needed for moderate pain or headache.    [provider]  allopurinol (ZYLOPRIM) 100 MG tablet Take 100 mg by mouth 2 (two) times daily.     [provider]  amLODipine (NORVASC) 5 MG tablet Take 5 mg by mouth 2 (two) times daily.     [provider]  Azelastine HCl 137 MCG/SPRAY SOLN Place 2 sprays into both nostrils 2 (two) times daily as needed (congestion). 12/24/19   [provider]  azithromycin (ZITHROMAX) 250 MG tablet Take 250-500 mg by mouth See admin instructions. Take 500 mg on day 1 then take 250 mg daily for 4 days    [provider]  benzonatate (TESSALON) 100 MG capsule Take 200 mg by mouth 3 (three) times daily as needed for cough.    [provider]  bisacodyl (DULCOLAX) 5 MG EC tablet Take 5 mg by mouth 2 (two) times daily.    [provider]  Calcipotriene 0.005 % solution Apply 1 application topically daily.    [provider]  Cholecalciferol (VITAMIN D) 50 MCG (2000 UT)  tablet Take 2,000 Units by mouth daily.    [provider]  glimepiride (AMARYL) 4 MG tablet Take 4 mg by mouth 2 (two) times daily.    [provider]  insulin glargine (LANTUS) 100 UNIT/ML injection Inject 15-30 Units into the skin See admin instructions. Inject 30 units in the morning and 15 units at night    [provider]  Insulin Pen Needle (NOVOFINE) 32G X 6 MM MISC Inject into the skin. 09/09/18   [provider]  ketoconazole (NIZORAL) 2 % shampoo Apply 1 application topically 2 (two) times a week. 12/05/19   [provider]  Lancets (ONETOUCH DELICA PLUS [id number]) [place] 1 each topically daily. 12/24/19   [provider]  loratadine (CLARITIN) 10 MG tablet Take 10 mg by mouth daily.    [provider]  losartan (COZAAR) 100 MG tablet Take 50 mg by mouth daily.    [provider]  [facility] VERIO test strip 1 each daily. 12/24/19   [provider]  triamcinolone ointment (KENALOG) 0.1 % Apply 1 application topically daily. 12/07/19   [provider]  vitamin C (ASCORBIC ACID) 500 MG tablet Take 500 mg by mouth daily.    [provider]      Allergies    Ibuprofen, Lisinopril, Pioglitazone, and Simvastatin    Review of Systems   Review of Systems  Gastrointestinal:  Positive for abdominal pain and constipation.  All other systems reviewed and are negative.  Physical Exam Updated Vital Signs BP (!) 146/85 (BP Location: Right Arm)    Pulse 85    Temp 98.1 F (36.7 C) (Oral)    Resp 17    Ht 5\' 10"  (1.778 m)    Wt 101.2 kg    SpO2 94%    BMI 32.00 kg/m  Physical Exam Vitals and nursing note reviewed.  Constitutional:      General: He is not in acute distress.    Appearance: Normal appearance.  HENT:     Head: Normocephalic and atraumatic.  Eyes:     Conjunctiva/sclera: Conjunctivae normal.     Pupils: Pupils are equal, round, and reactive to light.  Cardiovascular:     Rate and  Rhythm: Normal rate and regular rhythm.     Pulses: Normal pulses.  Pulmonary:     Effort: Pulmonary effort is normal. No respiratory distress.     Breath sounds: Normal breath sounds. No wheezing.     Comments: Speaking in full sentences.  Clear lung sounds in all fields. Abdominal:     General: There is no distension.     Palpations: Abdomen is soft. There is no mass.     Tenderness: There is abdominal tenderness. There is no guarding or rebound.     Comments: Pt states abd is more bloated than baseline. No focal ttp of the abd, mild ttp of upper abd. Abd is soft without rigidity, guarding, distention. Negative rebound  Musculoskeletal:        General: Normal range of motion.     Cervical back: Normal range of motion and neck supple.  Skin:    General: Skin is warm and dry.     Capillary Refill: Capillary refill takes less than 2 seconds.  Neurological:     Mental Status: He is alert and oriented to person, place, and time.  Psychiatric:        Mood and Affect: Mood and affect normal.        Speech: Speech normal.        Behavior: Behavior normal.    ED Results / Procedures / Treatments   Labs (all labs ordered are listed, but only abnormal results are displayed) Labs Reviewed  CBC WITH DIFFERENTIAL/PLATELET - Abnormal; Notable for the following components:      Result Value   Hemoglobin 12.9 (*)    HCT 38.7 (*)    RDW 15.7 (*)    All other components within normal limits  COMPREHENSIVE METABOLIC PANEL - Abnormal; Notable for the following components:   Glucose, Bld 109 (*)    BUN 35 (*)    Creatinine, Ser 2.60 (*)    GFR, Estimated 24 (*)    All other components within normal limits  LIPASE, BLOOD - Abnormal; Notable for the following components:   Lipase 90 (*)    All other components within normal limits    EKG None  Radiology DG Abdomen 1 View  Result Date: 06/05/2021 CLINICAL DATA:  Constipation for 3 days. Rule out small bowel obstruction. EXAM: ABDOMEN - 1  VIEW COMPARISON:  None. FINDINGS: Divided supine views of the abdomen obtained. No evidence of gaseous small bowel distension. There is scattered air throughout nondilated colon. Only minimal formed stool is seen. No evidence of free air in the supine views. Cholecystectomy clips in the right upper quadrant. No visualized radiopaque calculi. Lumbar spondylosis. The included lung bases are clear.  IMPRESSION: No evidence of bowel obstruction or ileus on the supine views. Minimal formed stool is seen throughout the colon. Electronically Signed   By: Keith Rake M.D.   On: 06/05/2021 22:38    Procedures Procedures    Medications Ordered in ED Medications - No data to display  ED Course/ Medical Decision Making/ A&P                           Medical Decision Making  Patient presenting for evaluation of constipation.  On exam, patient appears nontoxic.  He does have some mild tenderness palpation of the upper abdomen.  He is reporting passing gas less frequently.  Although he is not having any nausea, vomiting, or difficulty eating, have to consider small bowel obstruction.  Also consider constipation.  Could consider gastroparesis as patient is a diabetic.  History of CKD, consider dehydration/worsening kidney function.  Will obtain labs.  Unfortunately CT is not available at this facility this time, as such, will start with abdominal x-ray.  X-ray viewed and independently interpreted by me, shows normal bowel gas pattern.  Labs show worsening kidney function, but not far from baseline.  No leukocytosis.  Lipase is minimally elevated, but only at 90.  Exam is not consistent with pancreatitis.  Will try enema and reassess.   Patient signed out to Viona Gilmore, MD for follow up and reassessment.   Final Clinical Impression(s) / ED Diagnoses Final diagnoses:  None    Rx / DC Orders ED Discharge Orders     None         Franchot Heidelberg, PA-C 06/06/21 0006    Maudie Flakes, MD 06/06/21  [phone]

## 2021-06-06 NOTE — Discharge Instructions (Signed)
You were evaluated in the Emergency Department and after careful evaluation, we did not find any emergent condition requiring admission or further testing in the hospital.  Your exam/testing today is overall reassuring.  Continue stool softeners and/or enemas at home.  We discussed management options and plan is to return to the emergency department tomorrow for CT imaging if you do not have success at home tomorrow morning.  Please return to the Emergency Department if you experience any worsening of your condition.   Thank you for allowing [place] to be a part of your care.

## 2021-06-06 NOTE — ED Notes (Signed)
Soap suds done by this RN with little relief and not much stool. Pt still complaining of pain in belly and back area.

## 2021-06-07 ENCOUNTER — Emergency Department (HOSPITAL_COMMUNITY): Payer: Medicare Other

## 2021-06-07 ENCOUNTER — Emergency Department (HOSPITAL_COMMUNITY)
Admission: EM | Admit: 2021-06-07 | Discharge: 2021-06-08 | Disposition: A | Discharge status: Home / Self Care | Payer: Medicare Other | Attending: Emergency Medicine | Admitting: Emergency Medicine

## 2021-06-07 ENCOUNTER — Other Ambulatory Visit: Payer: Self-pay

## 2021-06-07 DIAGNOSIS — K5900 Constipation, unspecified: Secondary | ICD-10-CM | POA: Diagnosis not present

## 2021-06-07 DIAGNOSIS — R109 Unspecified abdominal pain: Secondary | ICD-10-CM | POA: Diagnosis present

## 2021-06-07 DIAGNOSIS — R739 Hyperglycemia, unspecified: Secondary | ICD-10-CM | POA: Insufficient documentation

## 2021-06-07 DIAGNOSIS — N189 Chronic kidney disease, unspecified: Secondary | ICD-10-CM | POA: Diagnosis not present

## 2021-06-07 LAB — URINALYSIS, ROUTINE W REFLEX MICROSCOPIC
Bilirubin Urine: NEGATIVE
Glucose, UA: NEGATIVE mg/dL
Hgb urine dipstick: NEGATIVE
Ketones, ur: NEGATIVE mg/dL
Leukocytes,Ua: NEGATIVE
Nitrite: NEGATIVE
Protein, ur: NEGATIVE mg/dL
Specific Gravity, Urine: 1.009 (ref 1.005–1.030)
pH: 5 (ref 5.0–8.0)

## 2021-06-07 LAB — CBC WITH DIFFERENTIAL/PLATELET
Abs Immature Granulocytes: 0.03 10*3/uL (ref 0.00–0.07)
Basophils Absolute: 0 10*3/uL (ref 0.0–0.1)
Basophils Relative: 1 %
Eosinophils Absolute: 0.3 10*3/uL (ref 0.0–0.5)
Eosinophils Relative: 4 %
HCT: 37.4 % — ABNORMAL LOW (ref 39.0–52.0)
Hemoglobin: 12.4 g/dL — ABNORMAL LOW (ref 13.0–17.0)
Immature Granulocytes: 0 %
Lymphocytes Relative: 28 %
Lymphs Abs: 1.9 10*3/uL (ref 0.7–4.0)
MCH: 28.6 pg (ref 26.0–34.0)
MCHC: 33.2 g/dL (ref 30.0–36.0)
MCV: 86.4 fL (ref 80.0–100.0)
Monocytes Absolute: 0.7 10*3/uL (ref 0.1–1.0)
Monocytes Relative: 11 %
Neutro Abs: 3.9 10*3/uL (ref 1.7–7.7)
Neutrophils Relative %: 56 %
Platelets: 242 10*3/uL (ref 150–400)
RBC: 4.33 MIL/uL (ref 4.22–5.81)
RDW: 15.5 % (ref 11.5–15.5)
WBC: 6.9 10*3/uL (ref 4.0–10.5)
nRBC: 0 % (ref 0.0–0.2)

## 2021-06-07 LAB — COMPREHENSIVE METABOLIC PANEL
ALT: 30 U/L (ref 0–44)
AST: 26 U/L (ref 15–41)
Albumin: 3.2 g/dL — ABNORMAL LOW (ref 3.5–5.0)
Alkaline Phosphatase: 66 U/L (ref 38–126)
Anion gap: 7 (ref 5–15)
BUN: 30 mg/dL — ABNORMAL HIGH (ref 8–23)
CO2: 22 mmol/L (ref 22–32)
Calcium: 8.7 mg/dL — ABNORMAL LOW (ref 8.9–10.3)
Chloride: 105 mmol/L (ref 98–111)
Creatinine, Ser: 2.47 mg/dL — ABNORMAL HIGH (ref 0.61–1.24)
GFR, Estimated: 26 mL/min — ABNORMAL LOW (ref >60)
Glucose, Bld: 185 mg/dL — ABNORMAL HIGH (ref 70–99)
Potassium: 4.6 mmol/L (ref 3.5–5.1)
Sodium: 134 mmol/L — ABNORMAL LOW (ref 135–145)
Total Bilirubin: 0.5 mg/dL (ref 0.3–1.2)
Total Protein: 7.3 g/dL (ref 6.5–8.1)

## 2021-06-07 LAB — LIPASE, BLOOD: Lipase: 34 U/L (ref 11–51)

## 2021-06-07 NOTE — ED Triage Notes (Signed)
Pt here for constipation since Friday. Pt went to ED on Monday for same and they didn't find anything was d/c home. Pt reports having 1 small BM this morning around 0333, but has not passed any flatulence since. Pt feels bloated and has pain on L side. Pt has had no N/V, has been eating liquid food only.

## 2021-06-07 NOTE — ED Provider Triage Note (Signed)
Emergency Medicine Provider Triage Evaluation Note  Andre Calderon , a 81 y.o. male  was evaluated in triage.  Pt complains of diffuse abdominal pain associated with abdominal distention. He admits to constipation. Unable to pass gas. He was seen at [facility] on 1/2 where an x-ray was obtained due to CT scan being down that demonstrated normal gas pattern. He has had a previous cholecystectomy. No nausea or vomiting. Denies urinary symptoms.   Review of Systems  Positive: Abdominal pain Negative: fever  Physical Exam  BP (!) 154/74 (BP Location: Left Arm)    Pulse 79    Temp 98.6 F (37 C) (Oral)    Resp 17    SpO2 95%  Gen:   Awake, no distress   Resp:  Normal effort  MSK:   Moves extremities without difficulty  Other:    Medical Decision Making  Medically screening exam initiated at 9:21 PM.  Appropriate orders placed.  HENRYK URSIN was informed that the remainder of the evaluation will be completed by another provider, this initial triage assessment does not replace that evaluation, and the importance of remaining in the ED until their evaluation is complete.  Labs CT abdomen to rule out obstruction   Suzy Bouchard, PA-C 06/07/21 2123

## 2021-06-08 LAB — CBG MONITORING, ED: Glucose-Capillary: 175 mg/dL — ABNORMAL HIGH (ref 70–99)

## 2021-06-08 MED ORDER — MAGNESIUM CITRATE PO SOLN: 1.0000 | Freq: Once | ORAL | 1 refills | Status: AC

## 2021-06-08 MED ORDER — DOCUSATE SODIUM 100 MG PO CAPS: 100.0000 mg | ORAL_CAPSULE | Freq: Two times a day (BID) | ORAL | 0 refills | Status: DC

## 2021-06-08 NOTE — ED Notes (Signed)
Pt is diabetic and felt like sugar was dropping. Wife got him some oatmeal and we are going to check cbg.  Pt is alert and with family

## 2021-06-08 NOTE — ED Provider Notes (Signed)
[facility] EMERGENCY DEPARTMENT Provider Note   CSN: [id number] Arrival date & time: 06/07/21  2040     History  Chief Complaint  Patient presents with   Constipation    Andre Calderon is a 81 y.o. male.   Constipation Associated symptoms: abdominal pain   Associated symptoms: no dysuria, no fever and no vomiting   Patient is a 81 year old male with medical history of CKD and creatinine plan to  Patient is present emergency room today for constipation start states that he has been constipated for approximately 1 week.  He states that he was seen 2 days ago in the emergency room and prescribed was told that he would need a CT scan however declined at that time.  He states that he has continued to have significant abdominal pain and distention and his last BM was 2 days ago.  No urinary sx.     Home Medications Prior to Admission medications   Medication Sig Start Date End Date Taking? Authorizing Provider  docusate sodium (COLACE) 100 MG capsule Take 1 capsule (100 mg total) by mouth 2 (two) times daily. 06/08/21  Yes Davona Kinoshita, Ova Freshwater S, PA  acetaminophen (TYLENOL) 500 MG tablet Take 1,000 mg by mouth every 6 (six) hours as needed for moderate pain or headache.    [provider]  allopurinol (ZYLOPRIM) 100 MG tablet Take 100 mg by mouth 2 (two) times daily.     [provider]  amLODipine (NORVASC) 5 MG tablet Take 5 mg by mouth 2 (two) times daily.     [provider]  Azelastine HCl 137 MCG/SPRAY SOLN Place 2 sprays into both nostrils 2 (two) times daily as needed (congestion). 12/24/19   [provider]  azithromycin (ZITHROMAX) 250 MG tablet Take 250-500 mg by mouth See admin instructions. Take 500 mg on day 1 then take 250 mg daily for 4 days    [provider]  benzonatate (TESSALON) 100 MG capsule Take 200 mg by mouth 3 (three) times daily as needed for cough.    [provider]  bisacodyl (DULCOLAX) 5 MG  EC tablet Take 5 mg by mouth 2 (two) times daily.    [provider]  Calcipotriene 0.005 % solution Apply 1 application topically daily.    [provider]  Cholecalciferol (VITAMIN D) 50 MCG (2000 UT) tablet Take 2,000 Units by mouth daily.    [provider]  glimepiride (AMARYL) 4 MG tablet Take 4 mg by mouth 2 (two) times daily.    [provider]  insulin glargine (LANTUS) 100 UNIT/ML injection Inject 15-30 Units into the skin See admin instructions. Inject 30 units in the morning and 15 units at night    [provider]  Insulin Pen Needle (NOVOFINE) 32G X 6 MM MISC Inject into the skin. 09/09/18   [provider]  ketoconazole (NIZORAL) 2 % shampoo Apply 1 application topically 2 (two) times a week. 12/05/19   [provider]  Lancets (ONETOUCH DELICA PLUS [id number]) [place] 1 each topically daily. 12/24/19   [provider]  loratadine (CLARITIN) 10 MG tablet Take 10 mg by mouth daily.    [provider]  losartan (COZAAR) 100 MG tablet Take 50 mg by mouth daily.    [provider]  [facility] VERIO test strip 1 each daily. 12/24/19   [provider]  triamcinolone ointment (KENALOG) 0.1 % Apply 1 application topically daily. 12/07/19   [provider]  vitamin  C (ASCORBIC ACID) 500 MG tablet Take 500 mg by mouth daily.    [provider]      Allergies    Ibuprofen, Lisinopril, Pioglitazone, and Simvastatin    Review of Systems   Review of Systems  Constitutional:  Negative for chills and fever.  HENT:  Negative for congestion.   Eyes:  Negative for pain.  Respiratory:  Negative for cough and shortness of breath.   Cardiovascular:  Negative for chest pain and leg swelling.  Gastrointestinal:  Positive for abdominal distention, abdominal pain and constipation. Negative for vomiting.  Genitourinary:  Negative for dysuria.  Musculoskeletal:  Negative for myalgias.  Skin:   Negative for rash.  Neurological:  Negative for dizziness and headaches.   Physical Exam Updated Vital Signs BP (!) 125/93    Pulse 78    Temp 98.9 F (37.2 C)    Resp 17    SpO2 96%  Physical Exam Vitals and nursing note reviewed.  Constitutional:      General: He is not in acute distress. HENT:     Head: Normocephalic and atraumatic.     Nose: Nose normal.  Eyes:     General: No scleral icterus. Cardiovascular:     Rate and Rhythm: Normal rate and regular rhythm.     Pulses: Normal pulses.     Heart sounds: Normal heart sounds.  Pulmonary:     Effort: Pulmonary effort is normal. No respiratory distress.     Breath sounds: No wheezing.  Abdominal:     General: There is distension.     Palpations: Abdomen is soft.     Tenderness: There is no abdominal tenderness. There is no guarding or rebound.     Comments: Not particular TTP   Musculoskeletal:     Cervical back: Normal range of motion.     Right lower leg: No edema.     Left lower leg: No edema.  Skin:    General: Skin is warm and dry.     Capillary Refill: Capillary refill takes less than 2 seconds.  Neurological:     Mental Status: He is alert. Mental status is at baseline.  Psychiatric:        Mood and Affect: Mood normal.        Behavior: Behavior normal.    ED Results / Procedures / Treatments   Labs (all labs ordered are listed, but only abnormal results are displayed) Labs Reviewed  CBC WITH DIFFERENTIAL/PLATELET - Abnormal; Notable for the following components:      Result Value   Hemoglobin 12.4 (*)    HCT 37.4 (*)    All other components within normal limits  COMPREHENSIVE METABOLIC PANEL - Abnormal; Notable for the following components:   Sodium 134 (*)    Glucose, Bld 185 (*)    BUN 30 (*)    Creatinine, Ser 2.47 (*)    Calcium 8.7 (*)    Albumin 3.2 (*)    GFR, Estimated 26 (*)    All other components within normal limits  URINALYSIS, ROUTINE W REFLEX MICROSCOPIC - Abnormal; Notable for the  following components:   Color, Urine STRAW (*)    All other components within normal limits  CBG MONITORING, ED - Abnormal; Notable for the following components:   Glucose-Capillary 175 (*)    All other components within normal limits  LIPASE, BLOOD    EKG EKG Interpretation  Date/Time:  Wednesday June 07 2021 21:35:38 EST Ventricular Rate:  76 PR Interval:  218 QRS Duration: 76 QT Interval:  374 QTC Calculation: 420 R Axis:   13 Text Interpretation: Sinus rhythm with 1st degree A-V block Low voltage QRS Borderline ECG When compared with ECG of 13-Jun-2020 09:43, No significant change since last tracing Confirmed by Wandra Arthurs [phone]) on 06/08/2021 6:22:52 PM  Radiology CT ABDOMEN PELVIS WO CONTRAST  Result Date: 06/08/2021 CLINICAL DATA:  Concern for bowel obstruction. EXAM: CT ABDOMEN AND PELVIS WITHOUT CONTRAST TECHNIQUE: Multidetector CT imaging of the abdomen and pelvis was performed following the standard protocol without IV contrast. COMPARISON:  None. FINDINGS: Evaluation of this exam is limited in the absence of intravenous contrast. Lower chest: Minimal bibasilar atelectasis. The visualized lung bases are otherwise clear. No intra-abdominal free air or free fluid. Hepatobiliary: The liver is unremarkable. No intrahepatic biliary ductal dilatation. Cholecystectomy. No retained calcified stone noted in the central CBD. Pancreas: Unremarkable. No pancreatic ductal dilatation or surrounding inflammatory changes. Spleen: Normal in size without focal abnormality. Adrenals/Urinary Tract: The adrenal glands are unremarkable. There is a 5 mm nonobstructing right renal upper pole calculus. Mild bilateral hydronephrosis versus parapelvic cyst. The visualized ureters appear unremarkable. There is mild bilateral perinephric stranding, nonspecific. The urinary bladder is grossly unremarkable. Stomach/Bowel: There is no bowel obstruction or active inflammation. The appendix is normal.  Vascular/Lymphatic: Moderate aortoiliac atherosclerotic disease. The IVC is unremarkable. No portal venous gas. There is no adenopathy. Reproductive: Mildly enlarged prostate gland measuring 6 cm in transverse axial diameter. The seminal vesicles are symmetric. Other: Small fat containing umbilical hernia. Musculoskeletal: Degenerative changes of the spine. No acute osseous pathology. IMPRESSION: 1. No acute intra-abdominal or pelvic pathology. No bowel obstruction. Normal appendix. 2. A 5 mm nonobstructing right renal upper pole calculus. Mild bilateral hydronephrosis versus parapelvic cyst. 3. Aortic Atherosclerosis (ICD10-I70.0). Electronically Signed   By: Anner Crete M.D.   On: 06/08/2021 02:23    Procedures Procedures    Medications Ordered in ED Medications - No data to display  ED Course/ Medical Decision Making/ A&P                           Medical Decision Making  Patient has significant constipation.  I evaluated patient in hallway bed because of limited availability and major care at the ER.  Patient's abdomen is distended but nontender.  He is passing gas intermittently.  CBC without leukocytosis or clinically significant anemia.  CMP with baseline creatinine no significant changes from prior.  Blood sugar somewhat elevated.  Urinalysis unremarkable.  CT abdomen pelvis personally reviewed by myself.  Shows no acute intra-abdominal pathology there is no obstruction, no SBO, although I did review the imaging which shows some diffuse colonic stool burden.  No fecal impaction.  Provide patient with mag citrate and Colace  I discussed this case with my attending physician who cosigned this note including patient's presenting symptoms, physical exam, and planned diagnostics and interventions. Attending physician stated agreement with plan or made changes to plan which were implemented.   Attending physician assessed patient at bedside.   Currently there is no availability  anywhere in the emergency department for patient to be placed in a closed room for an enema.  That being said I believe patient will benefit from mag citrate and Colace.  I also discussed maintenance MiraLAX and follow-up with gastroenterology.  Close return precautions given.  Patient agreeable to plan.  Will discharge at this time.  Final Clinical Impression(s) / ED Diagnoses Final  diagnoses:  Constipation, unspecified constipation type    Rx / DC Orders ED Discharge Orders          Ordered    magnesium citrate SOLN   Once        06/08/21 1513    docusate sodium (COLACE) 100 MG capsule  2 times daily        06/08/21 1513              Tedd Sias, [place] 06/09/21 1327    Malvin Johns, MD 06/09/21 1525

## 2021-06-08 NOTE — Discharge Instructions (Addendum)
I recommend going home today and taking 1 bottle of magnesium citrate.  Drink plenty of water afterwards.  You may eat and drink normally although I recommend refraining from carbohydrates like bread, rice, bananas.  I am sending you home with a prescription for Colace which you may take twice daily and I am also recommending that you take 1 capful of MiraLAX/17 g with each meal mixed into what ever beverage you choose.  Go ahead and schedule an appointment with a gastroenterologist

## 2023-07-25 IMAGING — DX DG ABDOMEN 1V
2 series · 2 of 2 positions shown · non-contrast
Comparison: None.

CLINICAL DATA: Constipation for 3 days. Rule out small bowel
obstruction.

EXAM:
ABDOMEN - 1 VIEW

[abdomen kub (1 of 2)]
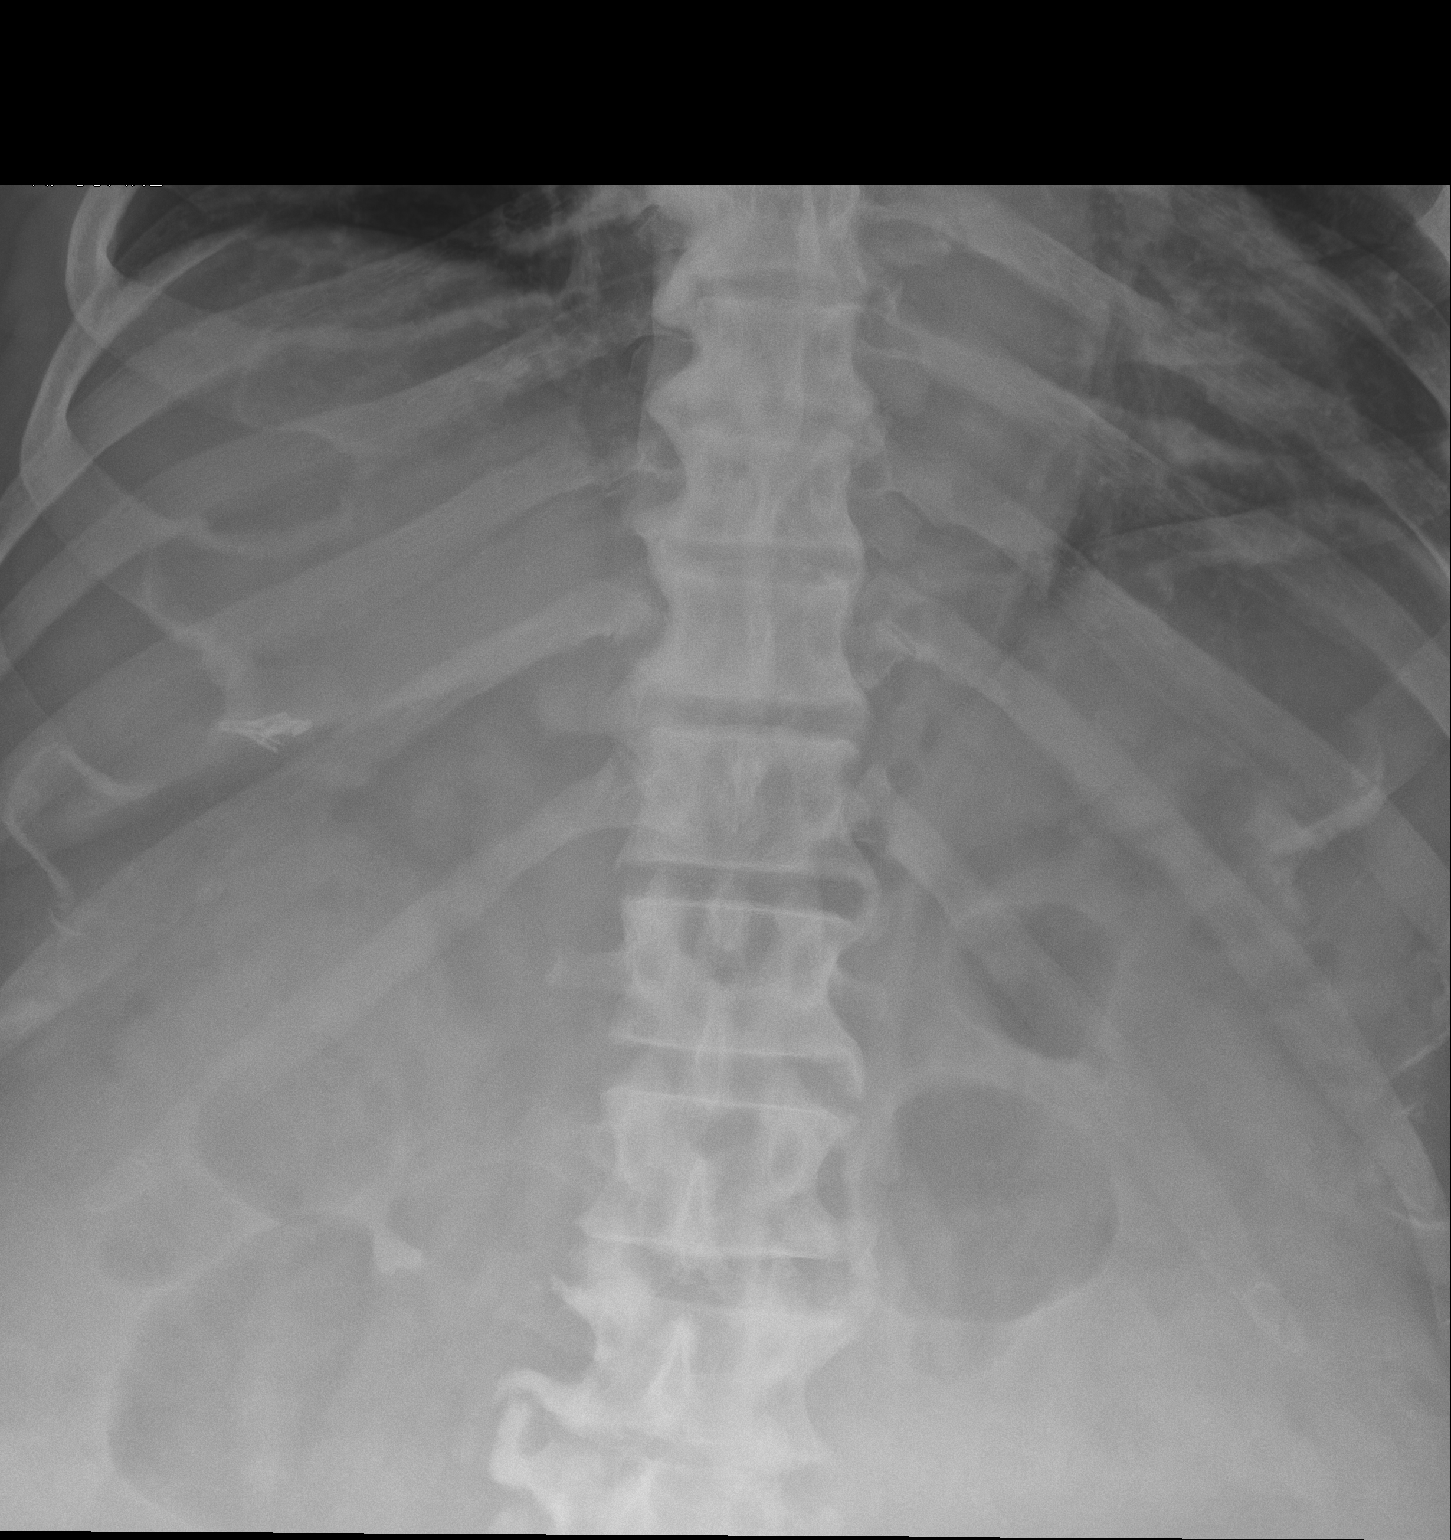

[abdomen kub (2 of 2)]
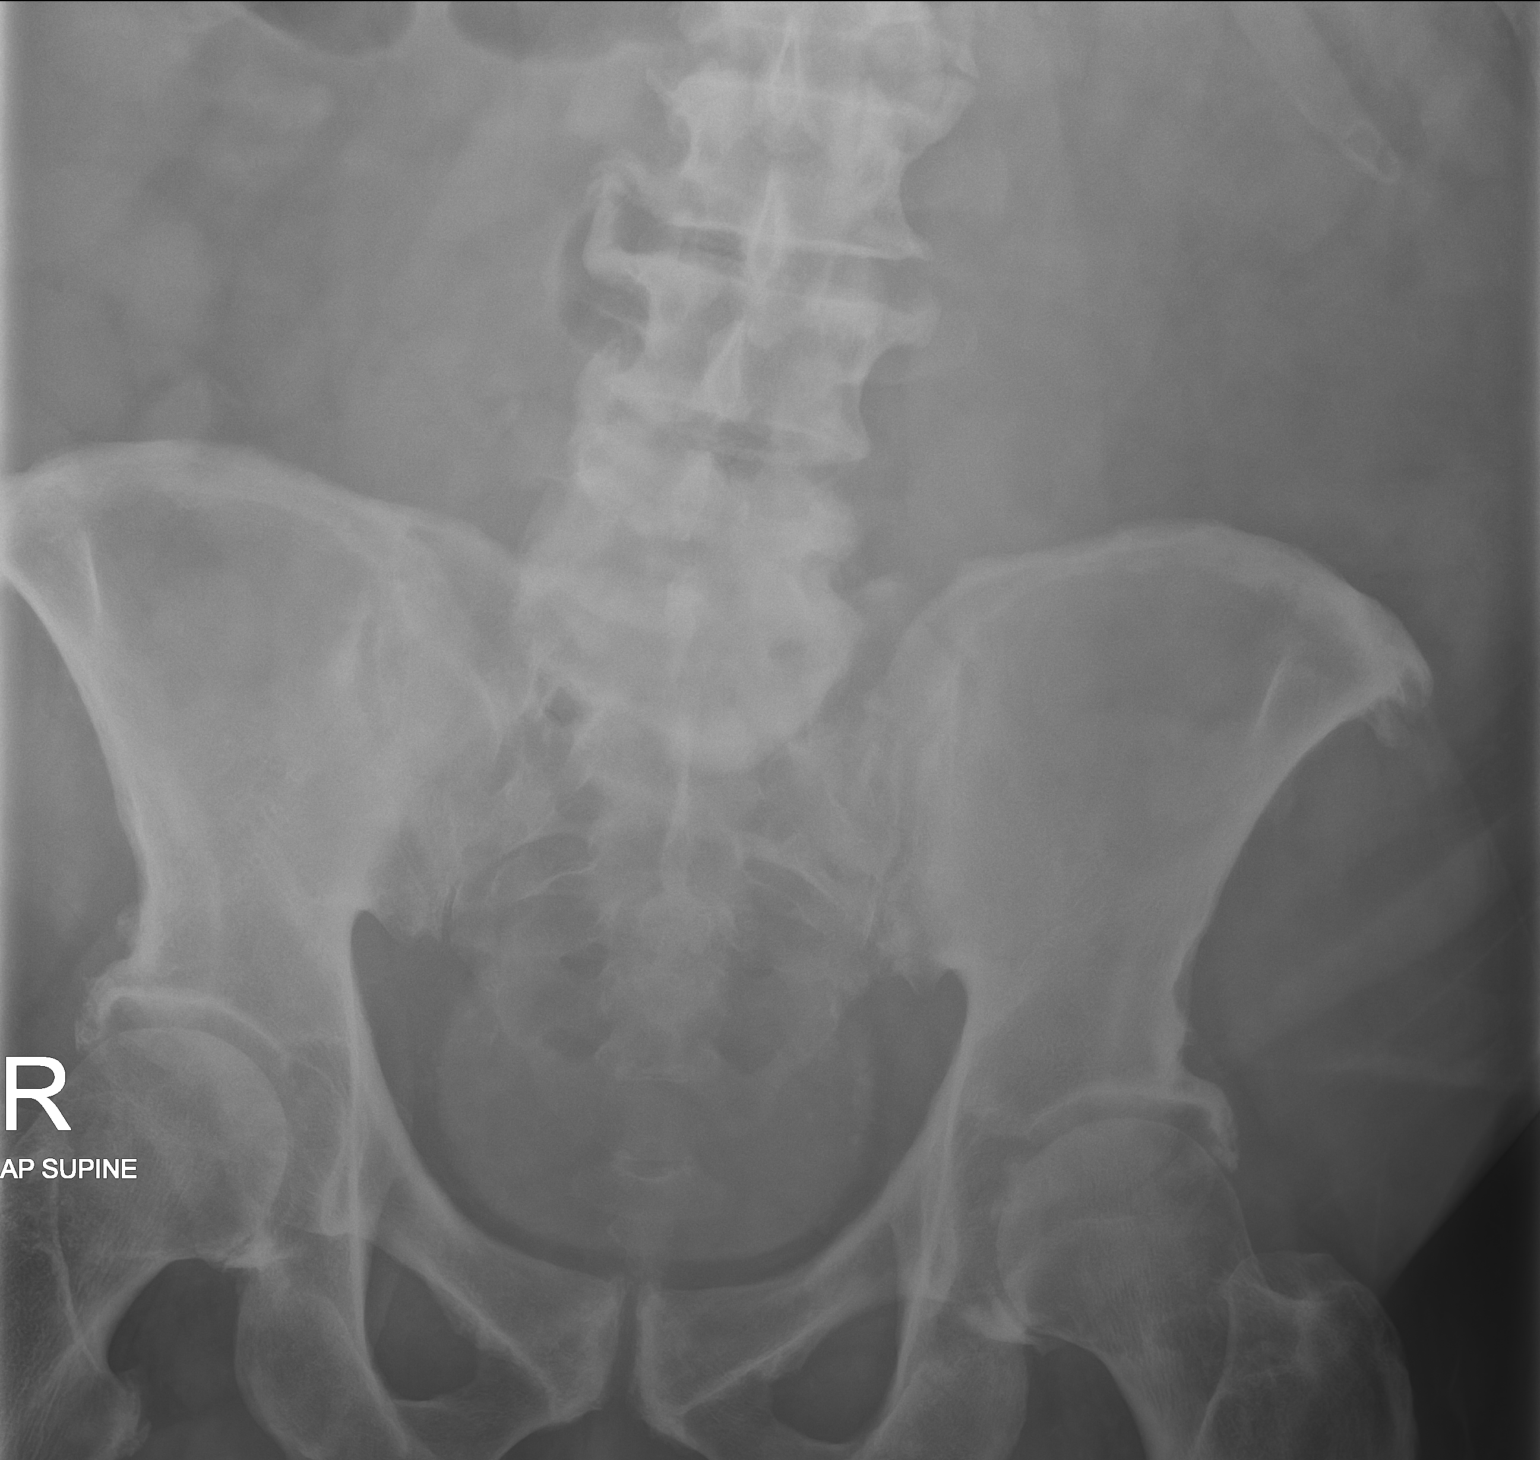

[2 of 2 positions shown; findings below may reference images not displayed]

FINDINGS: Divided supine views of the abdomen obtained. No evidence of gaseous
small bowel distension. There is scattered air throughout nondilated
colon. Only minimal formed stool is seen. No evidence of free air in
the supine views. Cholecystectomy clips in the right upper quadrant.
No visualized radiopaque calculi. Lumbar spondylosis. The included
lung bases are clear.
IMPRESSION: No evidence of bowel obstruction or ileus on the supine views.
Minimal formed stool is seen throughout the colon.

## 2023-07-27 IMAGING — CT CT ABD-PELV W/O CM
2 of 4 series · 16 of 46 positions shown, 18 images · non-contrast
Comparison: None.

CLINICAL DATA: Concern for bowel obstruction.

EXAM:
CT ABDOMEN AND PELVIS WITHOUT CONTRAST
TECHNIQUE: Multidetector CT imaging of the abdomen and pelvis was performed
following the standard protocol without IV contrast.

[Series 3: ap without · axial · non-contrast · 0.87mm/px · z∈[+883,+1303]mm · 13 of 94 slices shown, 15 images]
[im 5/94  soft-tissue]
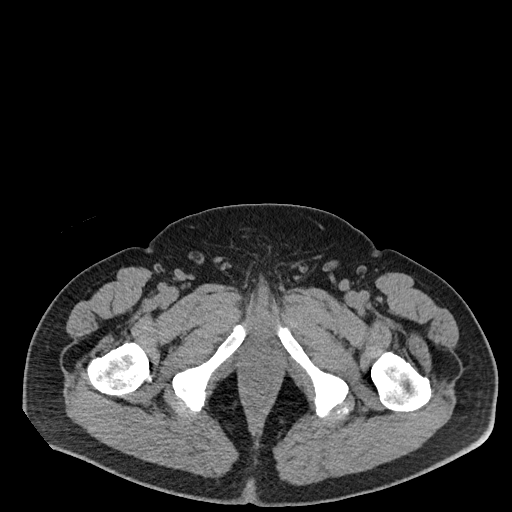
[im 5/94  bone]
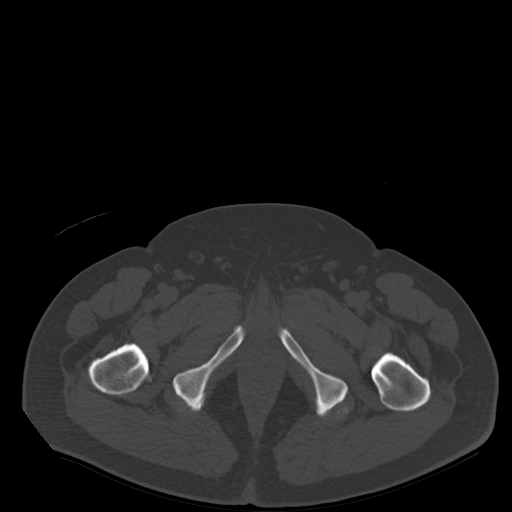
[im 15/94  soft-tissue]
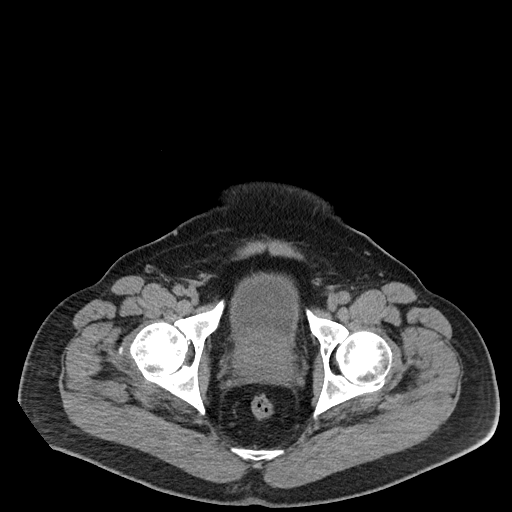
[im 20/94  soft-tissue]
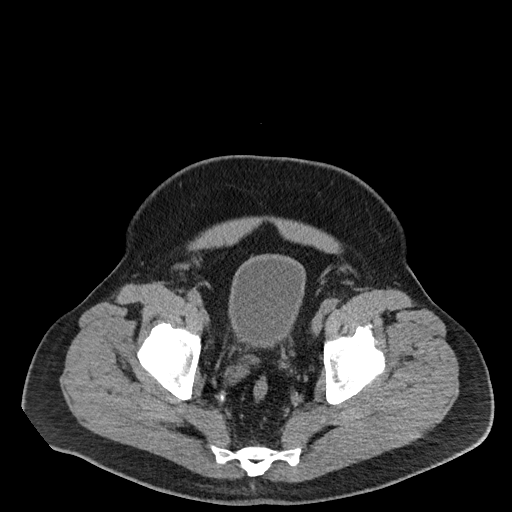
[im 25/94  soft-tissue]
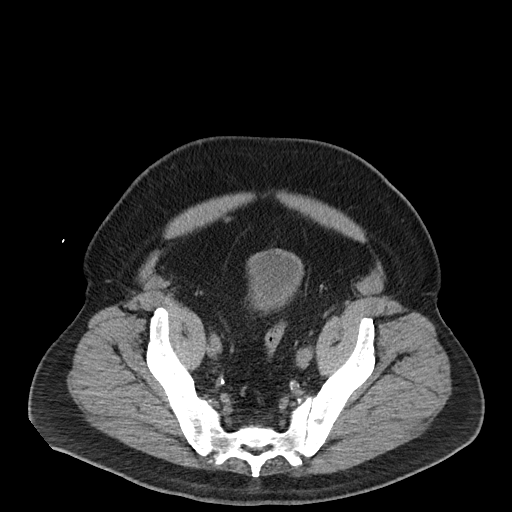
[im 35/94  soft-tissue]
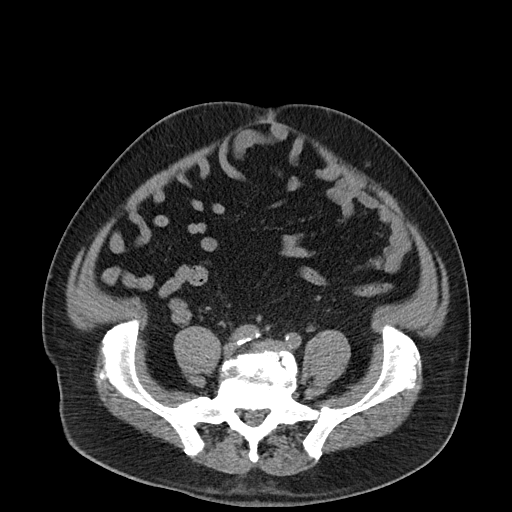
[im 40/94  soft-tissue]
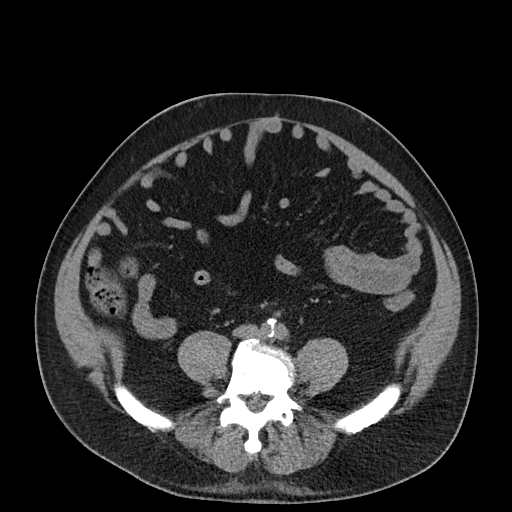
[im 49/94  soft-tissue]
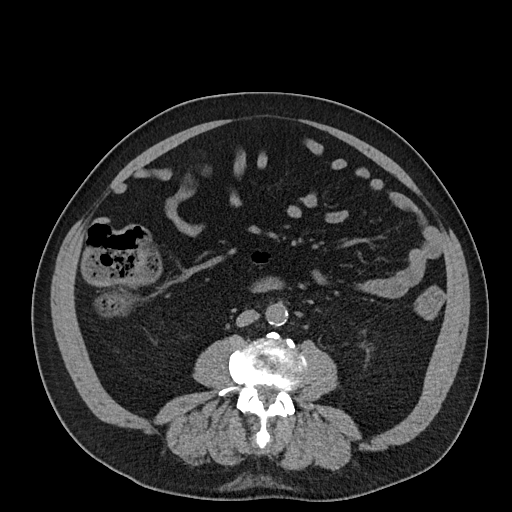
[im 54/94  soft-tissue]
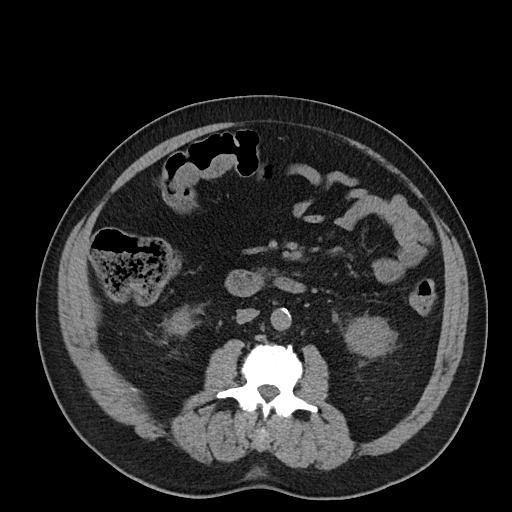
[im 59/94  soft-tissue]
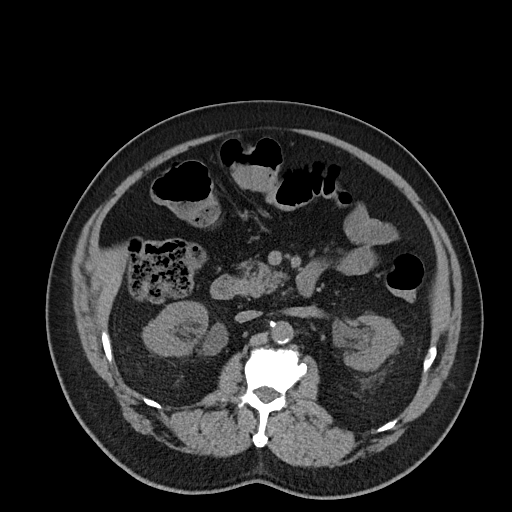
[im 59/94  bone]
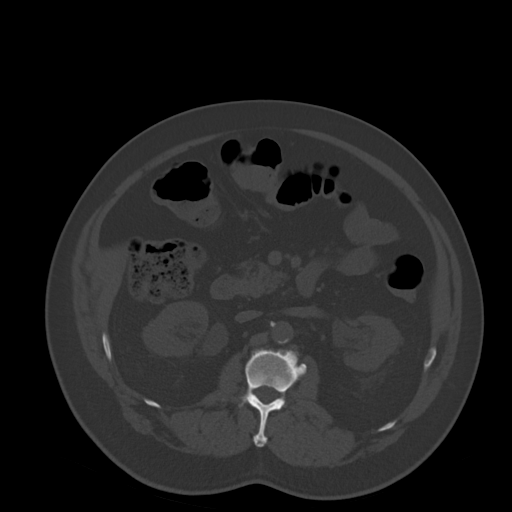
[im 69/94  soft-tissue]
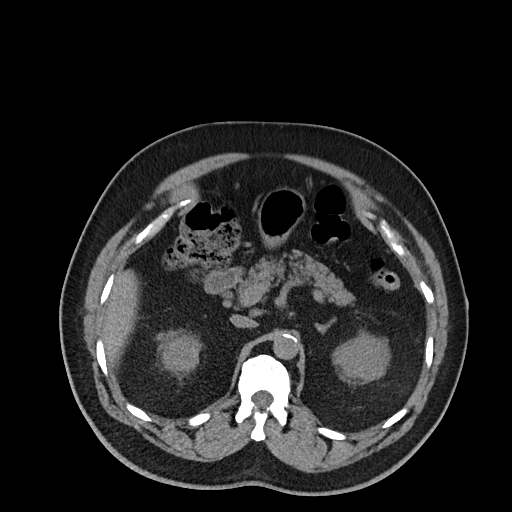
[im 74/94  soft-tissue]
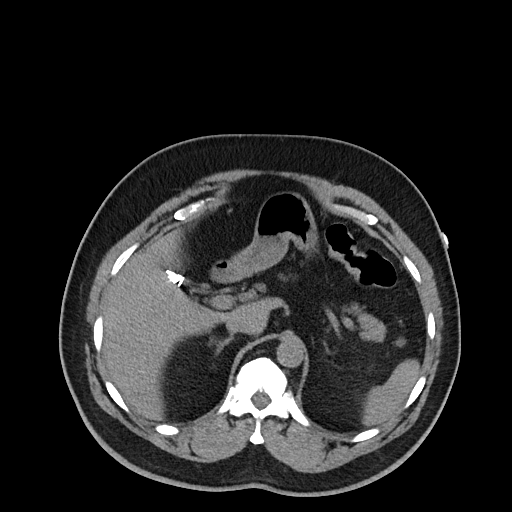
[im 79/94  soft-tissue]
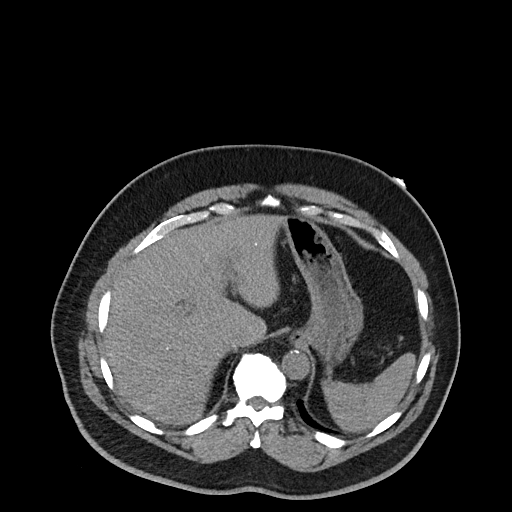
[im 89/94  soft-tissue]
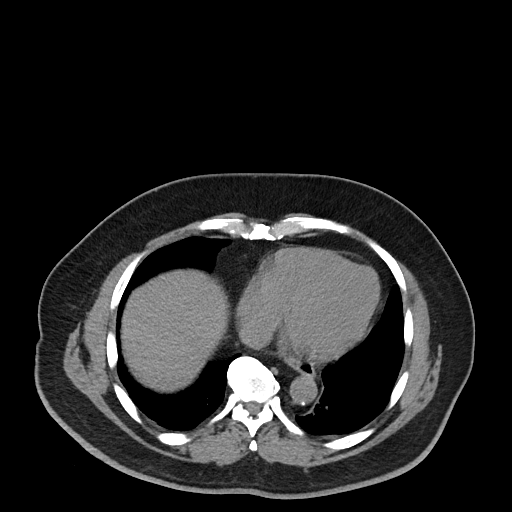

[Series 6: cor · coronal · 0.82mm/px · 3 of 117 slices shown]
[im 39/117  soft-tissue]
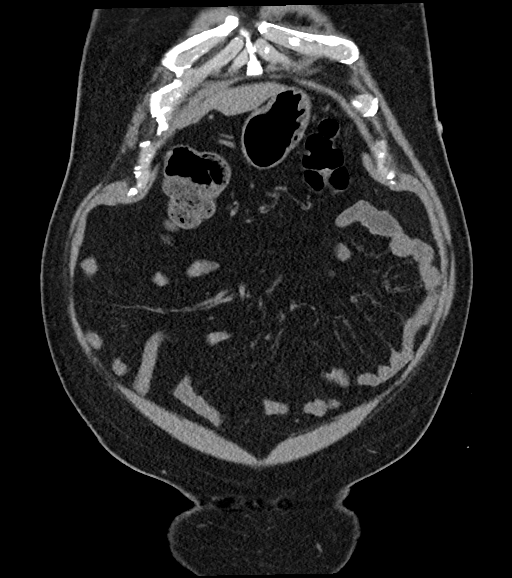
[im 52/117  soft-tissue]
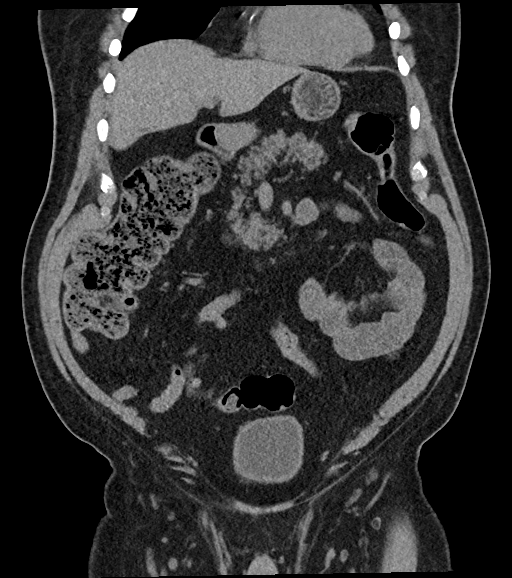
[im 65/117  soft-tissue]
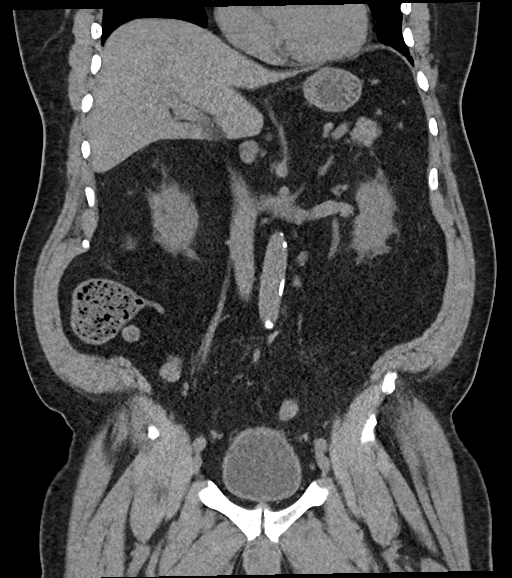

[16 of 46 positions shown; findings below may reference images not displayed]

FINDINGS: Evaluation of this exam is limited in the absence of intravenous
contrast.

Lower chest: Minimal bibasilar atelectasis. The visualized lung
bases are otherwise clear.

No intra-abdominal free air or free fluid.

Hepatobiliary: The liver is unremarkable. No intrahepatic biliary
ductal dilatation. Cholecystectomy. No retained calcified stone
noted in the central CBD.

Pancreas: Unremarkable. No pancreatic ductal dilatation or
surrounding inflammatory changes.

Spleen: Normal in size without focal abnormality.

Adrenals/Urinary Tract: The adrenal glands are unremarkable. There
is a 5 mm nonobstructing right renal upper pole calculus. Mild
bilateral hydronephrosis versus parapelvic cyst. The visualized
ureters appear unremarkable. There is mild bilateral perinephric
stranding, nonspecific. The urinary bladder is grossly unremarkable.

Stomach/Bowel: There is no bowel obstruction or active inflammation.
The appendix is normal.

Vascular/Lymphatic: Moderate aortoiliac atherosclerotic disease. The
IVC is unremarkable. No portal venous gas. There is no adenopathy.

Reproductive: Mildly enlarged prostate gland measuring 6 cm in
transverse axial diameter. The seminal vesicles are symmetric.

Other: Small fat containing umbilical hernia.

Musculoskeletal: Degenerative changes of the spine. No acute osseous
pathology.
IMPRESSION: 1. No acute intra-abdominal or pelvic pathology. No bowel
obstruction. Normal appendix.
2. A 5 mm nonobstructing right renal upper pole calculus. Mild
bilateral hydronephrosis versus parapelvic cyst.
3. Aortic Atherosclerosis ([id number].C).

## 2024-01-29 ENCOUNTER — Emergency Department (HOSPITAL_COMMUNITY)

## 2024-01-29 ENCOUNTER — Observation Stay (HOSPITAL_COMMUNITY)
Admission: EM | Admit: 2024-01-29 | Discharge: 2024-01-31 | Disposition: A | Discharge status: Home / Self Care | Attending: Internal Medicine | Admitting: Internal Medicine

## 2024-01-29 ENCOUNTER — Encounter (HOSPITAL_COMMUNITY): Payer: Self-pay

## 2024-01-29 ENCOUNTER — Other Ambulatory Visit: Payer: Self-pay

## 2024-01-29 DIAGNOSIS — I2510 Atherosclerotic heart disease of native coronary artery without angina pectoris: Secondary | ICD-10-CM | POA: Insufficient documentation

## 2024-01-29 DIAGNOSIS — Z79899 Other long term (current) drug therapy: Secondary | ICD-10-CM | POA: Diagnosis not present

## 2024-01-29 DIAGNOSIS — M109 Gout, unspecified: Secondary | ICD-10-CM | POA: Diagnosis not present

## 2024-01-29 DIAGNOSIS — R109 Unspecified abdominal pain: Secondary | ICD-10-CM | POA: Diagnosis not present

## 2024-01-29 DIAGNOSIS — R059 Cough, unspecified: Secondary | ICD-10-CM | POA: Insufficient documentation

## 2024-01-29 DIAGNOSIS — R519 Headache, unspecified: Secondary | ICD-10-CM | POA: Diagnosis not present

## 2024-01-29 DIAGNOSIS — R112 Nausea with vomiting, unspecified: Secondary | ICD-10-CM

## 2024-01-29 DIAGNOSIS — E1122 Type 2 diabetes mellitus with diabetic chronic kidney disease: Secondary | ICD-10-CM | POA: Insufficient documentation

## 2024-01-29 DIAGNOSIS — Z794 Long term (current) use of insulin: Secondary | ICD-10-CM | POA: Diagnosis not present

## 2024-01-29 DIAGNOSIS — Z87891 Personal history of nicotine dependence: Secondary | ICD-10-CM | POA: Diagnosis not present

## 2024-01-29 DIAGNOSIS — I509 Heart failure, unspecified: Secondary | ICD-10-CM | POA: Diagnosis not present

## 2024-01-29 DIAGNOSIS — R531 Weakness: Secondary | ICD-10-CM | POA: Insufficient documentation

## 2024-01-29 DIAGNOSIS — I10 Essential (primary) hypertension: Secondary | ICD-10-CM | POA: Diagnosis present

## 2024-01-29 DIAGNOSIS — I130 Hypertensive heart and chronic kidney disease with heart failure and stage 1 through stage 4 chronic kidney disease, or unspecified chronic kidney disease: Secondary | ICD-10-CM | POA: Diagnosis not present

## 2024-01-29 DIAGNOSIS — E119 Type 2 diabetes mellitus without complications: Secondary | ICD-10-CM

## 2024-01-29 DIAGNOSIS — R10819 Abdominal tenderness, unspecified site: Secondary | ICD-10-CM | POA: Diagnosis present

## 2024-01-29 DIAGNOSIS — Z7982 Long term (current) use of aspirin: Secondary | ICD-10-CM | POA: Insufficient documentation

## 2024-01-29 DIAGNOSIS — N184 Chronic kidney disease, stage 4 (severe): Secondary | ICD-10-CM | POA: Diagnosis not present

## 2024-01-29 DIAGNOSIS — R42 Dizziness and giddiness: Secondary | ICD-10-CM | POA: Diagnosis not present

## 2024-01-29 DIAGNOSIS — R1110 Vomiting, unspecified: Secondary | ICD-10-CM | POA: Diagnosis not present

## 2024-01-29 DIAGNOSIS — N189 Chronic kidney disease, unspecified: Secondary | ICD-10-CM

## 2024-01-29 LAB — COMPREHENSIVE METABOLIC PANEL WITH GFR
ALT: 22 U/L (ref 0–44)
AST: 24 U/L (ref 15–41)
Albumin: 3.7 g/dL (ref 3.5–5.0)
Alkaline Phosphatase: 77 U/L (ref 38–126)
Anion gap: 14 (ref 5–15)
BUN: 43 mg/dL — ABNORMAL HIGH (ref 8–23)
CO2: 18 mmol/L — ABNORMAL LOW (ref 22–32)
Calcium: 9.3 mg/dL (ref 8.9–10.3)
Chloride: 113 mmol/L — ABNORMAL HIGH (ref 98–111)
Creatinine, Ser: 2.71 mg/dL — ABNORMAL HIGH (ref 0.61–1.24)
GFR, Estimated: 23 mL/min — ABNORMAL LOW (ref >60)
Glucose, Bld: 192 mg/dL — ABNORMAL HIGH (ref 70–99)
Potassium: 4.1 mmol/L (ref 3.5–5.1)
Sodium: 145 mmol/L (ref 135–145)
Total Bilirubin: 0.5 mg/dL (ref 0.0–1.2)
Total Protein: 8.2 g/dL — ABNORMAL HIGH (ref 6.5–8.1)

## 2024-01-29 LAB — CBC
HCT: 38.7 % — ABNORMAL LOW (ref 39.0–52.0)
Hemoglobin: 12.6 g/dL — ABNORMAL LOW (ref 13.0–17.0)
MCH: 29 pg (ref 26.0–34.0)
MCHC: 32.6 g/dL (ref 30.0–36.0)
MCV: 89.2 fL (ref 80.0–100.0)
Platelets: 259 K/uL (ref 150–400)
RBC: 4.34 MIL/uL (ref 4.22–5.81)
RDW: 15.7 % — ABNORMAL HIGH (ref 11.5–15.5)
WBC: 10.3 K/uL (ref 4.0–10.5)
nRBC: 0 % (ref 0.0–0.2)

## 2024-01-29 LAB — LIPASE, BLOOD: Lipase: 57 U/L — ABNORMAL HIGH (ref 11–51)

## 2024-01-29 MED ORDER — ONDANSETRON HCL 4 MG/2ML IJ SOLN
4.0000 mg | Freq: Once | INTRAMUSCULAR | Status: AC
  Administered 2024-01-30: 4 mg via INTRAVENOUS
  Filled 2024-01-29: qty 2

## 2024-01-29 MED ORDER — MORPHINE SULFATE (PF) 4 MG/ML IV SOLN
4.0000 mg | Freq: Once | INTRAVENOUS | Status: AC
  Administered 2024-01-30: 4 mg via INTRAVENOUS
  Filled 2024-01-29: qty 1

## 2024-01-29 MED ORDER — LACTATED RINGERS IV BOLUS
1000.0000 mL | Freq: Once | INTRAVENOUS | Status: AC
  Administered 2024-01-30: 1000 mL via INTRAVENOUS

## 2024-01-29 NOTE — ED Triage Notes (Signed)
 Pt complaining of nausea, vomiting, and abdominal pain that started this morning when he got up.

## 2024-01-29 NOTE — ED Provider Notes (Signed)
 [place] EMERGENCY DEPARTMENT AT [facility]  Provider Note  CSN: [id number] Arrival date & time: 01/29/24 2026  History Chief Complaint  Patient presents with   Abdominal Pain    Andre Calderon is a 83 y.o. male with history of HTN, DM, CKD and CHF brought to the ED by wife for evaluation of vomiting and abdominal pain earlier this morning. He has not had a fever, no blood in emesis. No diarrhea.    Home Medications Prior to Admission medications   Medication Sig Start Date End Date Taking? Authorizing Provider  acetaminophen  (TYLENOL ) 500 MG tablet Take 1,000 mg by mouth every 6 (six) hours as needed for moderate pain or headache.    [provider]  allopurinol  (ZYLOPRIM ) 100 MG tablet Take 100 mg by mouth 2 (two) times daily.     [provider]  amLODipine  (NORVASC ) 5 MG tablet Take 5 mg by mouth 2 (two) times daily.     [provider]  Azelastine HCl 137 MCG/SPRAY SOLN Place 2 sprays into both nostrils 2 (two) times daily as needed (congestion). 12/24/19   [provider]  azithromycin (ZITHROMAX) 250 MG tablet Take 250-500 mg by mouth See admin instructions. Take 500 mg on day 1 then take 250 mg daily for 4 days    [provider]  benzonatate (TESSALON) 100 MG capsule Take 200 mg by mouth 3 (three) times daily as needed for cough.    [provider]  bisacodyl (DULCOLAX) 5 MG EC tablet Take 5 mg by mouth 2 (two) times daily.    [provider]  Calcipotriene 0.005 % solution Apply 1 application topically daily.    [provider]  Cholecalciferol (VITAMIN D) 50 MCG (2000 UT) tablet Take 2,000 Units by mouth daily.    [provider]  docusate sodium  (COLACE) 100 MG capsule Take 1 capsule (100 mg total) by mouth 2 (two) times daily. 06/08/21   Neldon Hamp RAMAN, PA  glimepiride  (AMARYL ) 4 MG tablet Take 4 mg by mouth 2 (two) times daily.    [provider]  insulin  glargine  (LANTUS ) 100 UNIT/ML injection Inject 15-30 Units into the skin See admin instructions. Inject 30 units in the morning and 15 units at night    [provider]  Insulin  Pen Needle (NOVOFINE) 32G X 6 MM MISC Inject into the skin. 09/09/18   [provider]  ketoconazole (NIZORAL) 2 % shampoo Apply 1 application topically 2 (two) times a week. 12/05/19   [provider]  Lancets (ONETOUCH DELICA PLUS [place]) MISC Apply 1 each topically daily. 12/24/19   [provider]  loratadine (CLARITIN) 10 MG tablet Take 10 mg by mouth daily.    [provider]  losartan  (COZAAR ) 100 MG tablet Take 50 mg by mouth daily.    [provider]  [facility] VERIO test strip 1 each daily. 12/24/19   [provider]  triamcinolone ointment (KENALOG) 0.1 % Apply 1 application topically daily. 12/07/19   [provider]  vitamin C (ASCORBIC ACID) 500 MG tablet Take 500 mg by mouth daily.    [provider]     Allergies    Ibuprofen, Lisinopril, Pioglitazone, and Simvastatin   Review of Systems   Review of Systems Please see HPI for pertinent positives and negatives  Physical Exam BP (!) 158/76   Pulse 84   Temp 98.1 F (36.7 C) (Oral)   Resp 17   Ht 5' 10 (  1.778 m)   Wt 104.3 kg   SpO2 91%   BMI 33.00 kg/m   Physical Exam Vitals and nursing note reviewed.  Constitutional:      Appearance: Normal appearance.  HENT:     Head: Normocephalic and atraumatic.     Nose: Nose normal.     Mouth/Throat:     Mouth: Mucous membranes are dry.  Eyes:     Extraocular Movements: Extraocular movements intact.     Conjunctiva/sclera: Conjunctivae normal.  Cardiovascular:     Rate and Rhythm: Normal rate.  Pulmonary:     Effort: Pulmonary effort is normal.     Breath sounds: Normal breath sounds.  Abdominal:     General: Abdomen is flat.     Palpations: Abdomen is soft.     Tenderness: There is abdominal tenderness. There is no  guarding.  Musculoskeletal:        General: No swelling. Normal range of motion.     Cervical back: Neck supple.  Skin:    General: Skin is warm and dry.  Neurological:     General: No focal deficit present.     Mental Status: He is alert.  Psychiatric:        Mood and Affect: Mood normal.     ED Results / Procedures / Treatments   EKG None  Procedures Procedures  Medications Ordered in the ED Medications  morphine  (PF) 4 MG/ML injection 4 mg (has no administration in time range)  ondansetron  (ZOFRAN ) injection 4 mg (has no administration in time range)  lactated ringers  bolus 1,000 mL (has no administration in time range)    Initial Impression and Plan  Patient here with several hours of vomiting and abdominal pains. No specific location, abdomen is diffusely tender but no peritoneal signs. Labs done in triage show CBC without leukocytosis, CMP with CKD about at baseline, LFTs are normal. Lipase not significantly elevated. I personally viewed the images from radiology studies and agree with radiologist interpretation: CT without acute findings. Will give pain/nausea meds and IVF for comfort and reassess.   ED Course       MDM Rules/Calculators/A&P Medical Decision Making Amount and/or Complexity of Data Reviewed Labs: ordered.  Risk Prescription drug management.     Final Clinical Impression(s) / ED Diagnoses Final diagnoses:  None    Rx / DC Orders ED Discharge Orders     None

## 2024-01-30 ENCOUNTER — Observation Stay (HOSPITAL_COMMUNITY)

## 2024-01-30 ENCOUNTER — Emergency Department (HOSPITAL_COMMUNITY)

## 2024-01-30 ENCOUNTER — Encounter (HOSPITAL_COMMUNITY): Payer: Self-pay | Admitting: Family Medicine

## 2024-01-30 DIAGNOSIS — M109 Gout, unspecified: Secondary | ICD-10-CM | POA: Diagnosis present

## 2024-01-30 DIAGNOSIS — E1122 Type 2 diabetes mellitus with diabetic chronic kidney disease: Secondary | ICD-10-CM

## 2024-01-30 DIAGNOSIS — R42 Dizziness and giddiness: Principal | ICD-10-CM

## 2024-01-30 DIAGNOSIS — N184 Chronic kidney disease, stage 4 (severe): Secondary | ICD-10-CM

## 2024-01-30 DIAGNOSIS — I2510 Atherosclerotic heart disease of native coronary artery without angina pectoris: Secondary | ICD-10-CM

## 2024-01-30 DIAGNOSIS — I10 Essential (primary) hypertension: Secondary | ICD-10-CM

## 2024-01-30 DIAGNOSIS — Z794 Long term (current) use of insulin: Secondary | ICD-10-CM

## 2024-01-30 LAB — CBC
HCT: 35.7 % — ABNORMAL LOW (ref 39.0–52.0)
Hemoglobin: 11.4 g/dL — ABNORMAL LOW (ref 13.0–17.0)
MCH: 29 pg (ref 26.0–34.0)
MCHC: 31.9 g/dL (ref 30.0–36.0)
MCV: 90.8 fL (ref 80.0–100.0)
Platelets: 182 K/uL (ref 150–400)
RBC: 3.93 MIL/uL — ABNORMAL LOW (ref 4.22–5.81)
RDW: 15.8 % — ABNORMAL HIGH (ref 11.5–15.5)
WBC: 8.7 K/uL (ref 4.0–10.5)
nRBC: 0 % (ref 0.0–0.2)

## 2024-01-30 LAB — URINALYSIS, ROUTINE W REFLEX MICROSCOPIC
Bilirubin Urine: NEGATIVE
Glucose, UA: NEGATIVE mg/dL
Hgb urine dipstick: NEGATIVE
Ketones, ur: NEGATIVE mg/dL
Leukocytes,Ua: NEGATIVE
Nitrite: NEGATIVE
Protein, ur: NEGATIVE mg/dL
Specific Gravity, Urine: 1.011 (ref 1.005–1.030)
pH: 5 (ref 5.0–8.0)

## 2024-01-30 LAB — RESP PANEL BY RT-PCR (RSV, FLU A&B, COVID)  RVPGX2
Influenza A by PCR: NEGATIVE
Influenza B by PCR: NEGATIVE
Resp Syncytial Virus by PCR: NEGATIVE
SARS Coronavirus 2 by RT PCR: NEGATIVE

## 2024-01-30 LAB — BASIC METABOLIC PANEL WITH GFR
Anion gap: 9 (ref 5–15)
BUN: 41 mg/dL — ABNORMAL HIGH (ref 8–23)
CO2: 22 mmol/L (ref 22–32)
Calcium: 8.8 mg/dL — ABNORMAL LOW (ref 8.9–10.3)
Chloride: 116 mmol/L — ABNORMAL HIGH (ref 98–111)
Creatinine, Ser: 2.71 mg/dL — ABNORMAL HIGH (ref 0.61–1.24)
GFR, Estimated: 23 mL/min — ABNORMAL LOW (ref >60)
Glucose, Bld: 136 mg/dL — ABNORMAL HIGH (ref 70–99)
Potassium: 4.7 mmol/L (ref 3.5–5.1)
Sodium: 147 mmol/L — ABNORMAL HIGH (ref 135–145)

## 2024-01-30 LAB — GLUCOSE, CAPILLARY
Glucose-Capillary: 125 mg/dL — ABNORMAL HIGH (ref 70–99)
Glucose-Capillary: 152 mg/dL — ABNORMAL HIGH (ref 70–99)
Glucose-Capillary: 209 mg/dL — ABNORMAL HIGH (ref 70–99)
Glucose-Capillary: 245 mg/dL — ABNORMAL HIGH (ref 70–99)

## 2024-01-30 MED ORDER — ACETAMINOPHEN 650 MG RE SUPP: 650.0000 mg | Freq: Four times a day (QID) | RECTAL | Status: DC | PRN

## 2024-01-30 MED ORDER — FENTANYL CITRATE (PF) 100 MCG/2ML IJ SOLN: 12.5000 ug | INTRAMUSCULAR | Status: DC | PRN

## 2024-01-30 MED ORDER — INSULIN ASPART 100 UNIT/ML IJ SOLN
0.0000 [IU] | Freq: Every day | INTRAMUSCULAR | Status: DC
  Administered 2024-01-30: 2 [IU] via SUBCUTANEOUS

## 2024-01-30 MED ORDER — INSULIN ASPART 100 UNIT/ML IJ SOLN: 0.0000 [IU] | Freq: Three times a day (TID) | INTRAMUSCULAR | Status: DC

## 2024-01-30 MED ORDER — LOSARTAN POTASSIUM 50 MG PO TABS
50.0000 mg | ORAL_TABLET | Freq: Every day | ORAL | Status: DC
  Administered 2024-01-30 – 2024-01-31 (×2): 50 mg via ORAL
  Filled 2024-01-30 (×2): qty 1

## 2024-01-30 MED ORDER — SODIUM CHLORIDE 0.9% FLUSH: 3.0000 mL | Freq: Two times a day (BID) | INTRAVENOUS | Status: DC

## 2024-01-30 MED ORDER — ALLOPURINOL 100 MG PO TABS
100.0000 mg | ORAL_TABLET | Freq: Two times a day (BID) | ORAL | Status: DC
  Administered 2024-01-30 – 2024-01-31 (×3): 100 mg via ORAL
  Filled 2024-01-30 (×3): qty 1

## 2024-01-30 MED ORDER — ONDANSETRON HCL 4 MG/2ML IJ SOLN
4.0000 mg | Freq: Once | INTRAMUSCULAR | Status: AC
  Administered 2024-01-30: 4 mg via INTRAVENOUS
  Filled 2024-01-30: qty 2

## 2024-01-30 MED ORDER — AMLODIPINE BESYLATE 5 MG PO TABS
5.0000 mg | ORAL_TABLET | Freq: Two times a day (BID) | ORAL | Status: DC
  Administered 2024-01-30 – 2024-01-31 (×3): 5 mg via ORAL
  Filled 2024-01-30 (×3): qty 1

## 2024-01-30 MED ORDER — DIAZEPAM 2 MG PO TABS: 2.0000 mg | ORAL_TABLET | Freq: Four times a day (QID) | ORAL | Status: DC | PRN

## 2024-01-30 MED ORDER — SCOPOLAMINE 1 MG/3DAYS TD PT72
1.0000 | MEDICATED_PATCH | TRANSDERMAL | Status: DC
  Administered 2024-01-30: 1 mg via TRANSDERMAL
  Filled 2024-01-30: qty 1

## 2024-01-30 MED ORDER — INSULIN GLARGINE-YFGN 100 UNIT/ML ~~LOC~~ SOLN
20.0000 [IU] | Freq: Every day | SUBCUTANEOUS | Status: DC
  Administered 2024-01-30: 20 [IU] via SUBCUTANEOUS
  Filled 2024-01-30 (×2): qty 0.2

## 2024-01-30 MED ORDER — PROCHLORPERAZINE EDISYLATE 10 MG/2ML IJ SOLN: 5.0000 mg | Freq: Four times a day (QID) | INTRAMUSCULAR | Status: DC | PRN

## 2024-01-30 MED ORDER — ONDANSETRON HCL 4 MG PO TABS
4.0000 mg | ORAL_TABLET | Freq: Four times a day (QID) | ORAL | Status: DC | PRN
  Administered 2024-01-31: 4 mg via ORAL
  Filled 2024-01-30: qty 1

## 2024-01-30 MED ORDER — MECLIZINE HCL 12.5 MG PO TABS
25.0000 mg | ORAL_TABLET | Freq: Three times a day (TID) | ORAL | Status: DC
  Administered 2024-01-30 – 2024-01-31 (×3): 25 mg via ORAL
  Filled 2024-01-30 (×3): qty 2

## 2024-01-30 MED ORDER — OXYCODONE HCL 5 MG PO TABS: 5.0000 mg | ORAL_TABLET | ORAL | Status: DC | PRN

## 2024-01-30 MED ORDER — MECLIZINE HCL 12.5 MG PO TABS: 25.0000 mg | ORAL_TABLET | Freq: Three times a day (TID) | ORAL | Status: DC | PRN

## 2024-01-30 MED ORDER — MECLIZINE HCL 12.5 MG PO TABS: 25.0000 mg | ORAL_TABLET | Freq: Three times a day (TID) | ORAL | Status: DC

## 2024-01-30 MED ORDER — HEPARIN SODIUM (PORCINE) 5000 UNIT/ML IJ SOLN
5000.0000 [IU] | Freq: Three times a day (TID) | INTRAMUSCULAR | Status: DC
  Administered 2024-01-30 – 2024-01-31 (×4): 5000 [IU] via SUBCUTANEOUS
  Filled 2024-01-30 (×4): qty 1

## 2024-01-30 MED ORDER — ONDANSETRON HCL 4 MG/2ML IJ SOLN
4.0000 mg | Freq: Four times a day (QID) | INTRAMUSCULAR | Status: DC | PRN
Start: 2024-01-30 — End: 2024-01-31
  Administered 2024-01-30: 4 mg via INTRAVENOUS
  Filled 2024-01-30 (×2): qty 2

## 2024-01-30 MED ORDER — ACETAMINOPHEN 325 MG PO TABS: 650.0000 mg | ORAL_TABLET | Freq: Four times a day (QID) | ORAL | Status: DC | PRN

## 2024-01-30 MED ORDER — MECLIZINE HCL 12.5 MG PO TABS
25.0000 mg | ORAL_TABLET | Freq: Three times a day (TID) | ORAL | Status: DC | PRN
  Administered 2024-01-30: 25 mg via ORAL
  Filled 2024-01-30: qty 2

## 2024-01-30 MED ORDER — SENNA 8.6 MG PO TABS: 1.0000 | ORAL_TABLET | Freq: Every day | ORAL | Status: DC | PRN

## 2024-01-30 NOTE — Evaluation (Signed)
 Physical Therapy Evaluation Patient Details Name: Andre Calderon MRN: [id number] DOB: 12-02-1940 Today's Date: 01/30/2024  History of Present Illness  Andre Calderon is an 83 y.o. male with medical history significant for hypertension, hyperlipidemia, diabetes mellitus, gout, CKD stage IV, and CAD who presents with room spinning sensation, nausea, and vomiting.     Patient reports that he had just been getting over a recent upper respiratory infection and was beginning to feel much better until yesterday morning when he experienced a room spinning sensation upon rising.  Since then, the patient has continued to experience a room spinning sensation with nausea and vomiting.  Symptoms are better when he is laying down and worse anytime he tries to get up or look to the side.  There is no headache, change in vision or hearing, or focal numbness or weakness associated with this.  He denies fevers or chills.  Despite earlier documentation, he has not had any abdominal pain associated with this.   Clinical Impression  Patient demonstrates slow labored movement for sitting up at bedside, once seated c/o increased dizziness, poor tolerance for standing, had to use RW, unable to take steps due to c/o severe dizziness and weakness and had episode of vomiting. Patient negative for nystagmus for left/right [facility], but continues to c/o severe dizziness which is worse when standing and had orthostatic BP's as follows, lying: 123/68, sitting 139/77, and after standing for 2-3 minutes, but had to sit due to weakness 146/71 - nurse notified. Patient requested to go back to bed after therapy. Patient will benefit from continued skilled physical therapy in hospital and recommended venue below to increase strength, balance, endurance for safe ADLs and gait.          If plan is discharge home, recommend the following: A lot of help with bathing/dressing/bathroom;A lot of help with walking and/or transfers;Help with  stairs or ramp for entrance;Assistance with cooking/housework;Assist for transportation   Can travel by private vehicle   No    Equipment Recommendations None recommended by PT  Recommendations for Other Services       Functional Status Assessment Patient has had a recent decline in their functional status and demonstrates the ability to make significant improvements in function in a reasonable and predictable amount of time.     Precautions / Restrictions Precautions Precautions: Fall Recall of Precautions/Restrictions: Intact Restrictions Weight Bearing Restrictions Per Provider Order: No      Mobility  Bed Mobility Overal bed mobility: Needs Assistance Bed Mobility: Supine to Sit, Sit to Supine     Supine to sit: Min assist Sit to supine: Contact guard assist, Min assist   General bed mobility comments: slow labored movement    Transfers Overall transfer level: Needs assistance Equipment used: Rolling walker (2 wheels) Transfers: Sit to/from Stand Sit to Stand: Mod assist           General transfer comment: unsteady labored movement with buckling of legs once fatigued    Ambulation/Gait                  Stairs            Wheelchair Mobility     Tilt Bed    Modified Rankin (Stroke Patients Only)       Balance Overall balance assessment: Needs assistance Sitting-balance support: Feet supported, No upper extremity supported Sitting balance-Leahy Scale: Fair Sitting balance - Comments: fair/good seated at EOB   Standing balance support: Reliant on assistive device  for balance, During functional activity, Bilateral upper extremity supported Standing balance-Leahy Scale: Poor Standing balance comment: using RW                             Pertinent Vitals/Pain Pain Assessment Pain Assessment: No/denies pain    Home Living Family/patient expects to be discharged to:: Private residence Living Arrangements:  Spouse/significant other Available Help at Discharge: Family;Available 24 hours/day Type of Home: House Home Access: Stairs to enter Entrance Stairs-Rails: Right;Left;Can reach both Entrance Stairs-Number of Steps: 3   Home Layout: One level Home Equipment: Agricultural consultant (2 wheels);Cane - single point;Shower seat;Grab bars - tub/shower      Prior Function Prior Level of Function : Independent/Modified Independent;Driving             Mobility Comments: community ambulation without AD, drives ADLs Comments: Independent     Extremity/Trunk Assessment   Upper Extremity Assessment Upper Extremity Assessment: Generalized weakness    Lower Extremity Assessment Lower Extremity Assessment: Generalized weakness    Cervical / Trunk Assessment Cervical / Trunk Assessment: Kyphotic;Other exceptions Cervical / Trunk Exceptions: limited neck extension due to stiffness  Communication   Communication Communication: No apparent difficulties    Cognition Arousal: Alert Behavior During Therapy: WFL for tasks assessed/performed   PT - Cognitive impairments: No apparent impairments                         Following commands: Intact       Cueing Cueing Techniques: Verbal cues, Tactile cues     General Comments      Exercises     Assessment/Plan    PT Assessment Patient needs continued PT services  PT Problem List Decreased strength;Decreased activity tolerance;Decreased balance;Decreased mobility;Other (comment) (Severe dizziness)       PT Treatment Interventions DME instruction;Gait training;Stair training;Functional mobility training;Therapeutic activities;Therapeutic exercise;Balance training;Patient/family education    PT Goals (Current goals can be found in the Care Plan section)  Acute Rehab PT Goals Patient Stated Goal: return home with family to assist PT Goal Formulation: With patient/family Time For Goal Achievement: 02/13/24 Potential to Achieve  Goals: Good    Frequency Min 3X/week     Co-evaluation               AM-PAC PT 6 Clicks Mobility  Outcome Measure Help needed turning from your back to your side while in a flat bed without using bedrails?: A Little Help needed moving from lying on your back to sitting on the side of a flat bed without using bedrails?: A Little Help needed moving to and from a bed to a chair (including a wheelchair)?: A Lot Help needed standing up from a chair using your arms (e.g., wheelchair or bedside chair)?: A Lot Help needed to walk in hospital room?: A Lot Help needed climbing 3-5 steps with a railing? : A Lot 6 Click Score: 14    End of Session   Activity Tolerance: Patient tolerated treatment well;Patient limited by fatigue;Other (comment) (Patient limited by dizziness) Patient left: in bed;with family/visitor present Nurse Communication: Mobility status PT Visit Diagnosis: Unsteadiness on feet (R26.81);Other abnormalities of gait and mobility (R26.89);Muscle weakness (generalized) (M62.81)    Time: 9094-9054 PT Time Calculation (min) (ACUTE ONLY): 40 min   Charges:   PT Evaluation $PT Eval Moderate Complexity: 1 Mod PT Treatments $Therapeutic Activity: 38-52 mins           1:51  PM, 01/30/24 Lynwood Music, MPT Physical Therapist with [facility] 336 [phone] office [phone] mobile phone

## 2024-01-30 NOTE — Progress Notes (Signed)
 PROGRESS NOTE    Andre Calderon  FMW:[id number] DOB: 01/07/1941 DOA: 01/29/2024 PCP: Graydon Mt, MD   Brief Narrative:    Andre Calderon is an 83 y.o. male with medical history significant for hypertension, hyperlipidemia, diabetes mellitus, gout, CKD stage IV, and CAD who presents with room spinning sensation, nausea, and vomiting.   Patient reports that he had just been getting over a recent upper respiratory infection and was beginning to feel much better until yesterday morning when he experienced a room spinning sensation upon rising.  He was admitted for evaluation of vertigo and has had brain MRI with no acute findings noted.  Assessment & Plan:   Principal Problem:   Vertigo Active Problems:   CKD (chronic kidney disease), stage IV (HCC)   Type 2 diabetes mellitus (HCC)   CAD (coronary artery disease)   Hypertension   Gout  Assessment and Plan:  1. Vertigo  - He is unable to sit up without significant symptoms despite IVF hydration and antiemetics in ED  - MRI brain negative -Continue symptomatic management with meclizine , antiemetics-start scopolamine  patch, and antihistamines along with benzodiazepines   2. Hypertension  - Losartan  and amlodipine      3. Type II DM  - A1c was 8.3% in June 2025  - Check CBGs, continue basal insulin , add low-intensity SSI for now     4. CKD IV  - Appears close to baseline  - Renally-dose medications    5. CAD  - No anginal symptoms    6.  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   Consultants:  None  Procedures:  None  Antimicrobials:  None   Subjective: Patient seen and evaluated today with ongoing nausea since early this morning, but no vomiting.  Continues to have significant dizziness with no improvement in  symptoms.  Objective: Vitals:   01/30/24 0330 01/30/24 0430 01/30/24 0516 01/30/24 0800  BP: 129/67 (!) 108/48 127/71 113/63  Pulse: 76 75 79   Resp:   20   Temp:   98.2 F (36.8 C)   TempSrc:   Oral   SpO2: 96% 98% 94%   Weight:   100.4 kg   Height:   5' 10 (1.778 m)    No intake or output data in the 24 hours ending 01/30/24 1118 Filed Weights   01/29/24 2032 01/30/24 0516  Weight: 104.3 kg 100.4 kg    Examination:  General exam: Appears calm and comfortable  Respiratory system: Clear to auscultation. Respiratory effort normal. Cardiovascular system: S1 & S2 heard, RRR.  Gastrointestinal system: Abdomen is soft Central nervous system: Alert and awake Extremities: No edema Skin: No significant lesions noted Psychiatry: Flat affect.    Data Reviewed: I have personally reviewed following labs and imaging studies  CBC: Recent Labs  Lab 01/29/24 2043 01/30/24 0531  WBC 10.3 8.7  HGB 12.6* 11.4*  HCT 38.7* 35.7*  MCV 89.2 90.8  PLT 259 182   Basic Metabolic Panel: Recent Labs  Lab 01/29/24 2043 01/30/24 0531  NA 145 147*  K 4.1 4.7  CL 113* 116*  CO2 18* 22  GLUCOSE 192* 136*  BUN 43* 41*  CREATININE 2.71* 2.71*  CALCIUM 9.3 8.8*   GFR: Estimated Creatinine Clearance: 25 mL/min (A) (by C-G formula based on SCr of 2.71 mg/dL (H)). Liver Function Tests: Recent Labs  Lab 01/29/24 2043  AST 24  ALT 22  ALKPHOS 77  BILITOT 0.5  PROT 8.2*  ALBUMIN 3.7   Recent Labs  Lab 01/29/24 2043  LIPASE 57*   No results for input(s): AMMONIA in the last 168 hours. Coagulation Profile: No results for input(s): INR, PROTIME in the last 168 hours. Cardiac Enzymes: No results for input(s): CKTOTAL, CKMB, CKMBINDEX, TROPONINI in the last 168 hours. BNP (last 3 results) No results for input(s): PROBNP in the last 8760 hours. HbA1C: No results for input(s): HGBA1C in the last 72 hours. CBG: Recent Labs  Lab 01/30/24 0736  GLUCAP 125*    Lipid Profile: No results for input(s): CHOL, HDL, LDLCALC, TRIG, CHOLHDL, LDLDIRECT in the last 72 hours. Thyroid  Function Tests: No results for input(s): TSH, T4TOTAL, FREET4, T3FREE, THYROIDAB in the last 72 hours. Anemia Panel: No results for input(s): VITAMINB12, FOLATE, FERRITIN, TIBC, IRON, RETICCTPCT in the last 72 hours. Sepsis Labs: No results for input(s): PROCALCITON, LATICACIDVEN in the last 168 hours.  Recent Results (from the past 240 hours)  Resp panel by RT-PCR (RSV, Flu A&B, Covid) Anterior Nasal Swab     Status: None   Collection Time: 01/30/24  2:24 AM   Specimen: Anterior Nasal Swab  Result Value Ref Range Status   SARS Coronavirus 2 by RT PCR NEGATIVE NEGATIVE Final    Comment: (NOTE) SARS-CoV-2 target nucleic acids are NOT DETECTED.  The SARS-CoV-2 RNA is generally detectable in upper respiratory specimens during the acute phase of infection. The lowest concentration of SARS-CoV-2 viral copies this assay can detect is 138 copies/mL. A negative result does not preclude SARS-Cov-2 infection and should not be used as the sole basis for treatment or other patient management decisions. A negative result may occur with  improper specimen collection/handling, submission of specimen other than nasopharyngeal swab, presence of viral mutation(s) within the areas targeted by this assay, and inadequate number of viral copies(<138 copies/mL). A negative result must be combined with clinical observations, patient history, and epidemiological information. The expected result is Negative.  Fact Sheet for Patients:  BloggerCourse.com  Fact Sheet for Healthcare Providers:  SeriousBroker.it  This test is no t yet approved or cleared by the [place]  FDA and  has been authorized for detection and/or diagnosis of SARS-CoV-2 by FDA under an Emergency Use Authorization (EUA). This EUA  will remain  in effect (meaning this test can be used) for the duration of the COVID-19 declaration under Section 564(b)(1) of the Act, 21 U.S.C.section 360bbb-3(b)(1), unless the authorization is terminated  or revoked sooner.       Influenza A by PCR NEGATIVE NEGATIVE Final   Influenza B by PCR NEGATIVE NEGATIVE Final    Comment: (NOTE) The Xpert Xpress SARS-CoV-2/FLU/RSV plus assay is intended as an aid in the diagnosis of influenza from Nasopharyngeal swab specimens and should not be used as a sole basis for treatment. Nasal washings and aspirates are unacceptable for Xpert Xpress SARS-CoV-2/FLU/RSV testing.  Fact Sheet for Patients: BloggerCourse.com  Fact Sheet for Healthcare Providers: SeriousBroker.it  This test is not yet approved or cleared by the [place]  FDA and has been authorized for detection and/or diagnosis of SARS-CoV-2 by FDA under an Emergency Use Authorization (EUA). This EUA will remain in effect (meaning this test can be used) for the duration of the COVID-19 declaration under Section 564(b)(1) of the Act, 21 U.S.C. section 360bbb-3(b)(1), unless the authorization is terminated or revoked.     Resp Syncytial Virus by PCR NEGATIVE  NEGATIVE Final    Comment: (NOTE) Fact Sheet for Patients: BloggerCourse.com  Fact Sheet for Healthcare Providers: SeriousBroker.it  This test is not yet approved or cleared by the [place]  FDA and has been authorized for detection and/or diagnosis of SARS-CoV-2 by FDA under an Emergency Use Authorization (EUA). This EUA will remain in effect (meaning this test can be used) for the duration of the COVID-19 declaration under Section 564(b)(1) of the Act, 21 U.S.C. section 360bbb-3(b)(1), unless the authorization is terminated or revoked.  Performed at [facility], [address] [place], [place] [address]           Radiology Studies: MR BRAIN WO CONTRAST Result Date: 01/30/2024 EXAM: MR Brain without Intravenous Contrast. CLINICAL HISTORY: Neuro deficit, acute, stroke suspected. Neuro deficit, acute, stroke suspected. Neuro deficit, acute, stroke suspected. Neuro deficit, acute, stroke suspected. TECHNIQUE: Magnetic resonance images of the brain without intravenous contrast in multiple planes. CONTRAST: None. COMPARISON: CT of the head dated 12/07/2007. FINDINGS: BRAIN: No restricted diffusion to indicate acute infarction. No intracranial mass or hemorrhage. No midline shift or extra-axial fluid collection. No cerebellar tonsillar ectopia. The central arterial and venous flow voids are patent. There is age-related atrophy and mild periventricular and subcortical white matter disease. VENTRICLES: No hydrocephalus. ORBITS: The patient is status post bilateral lens replacement. SINUSES AND MASTOIDS: The sinuses and mastoid air cells are clear. BONES: No acute fracture or focal osseous lesion. 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 There are extensive anterior osteophytes also noted in the upper cervical spine. IMPRESSION: 1. No acute findings. 2. Age-related atrophy and mild periventricular and subcortical white matter disease. 3. 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 Electronically signed by: Evalene Coho MD 01/30/2024 10:49 AM EDT RP Workstation: [id number]   DG Chest Port 1 View Result Date: 01/30/2024 CLINICAL DATA:  Cough and hypoxia EXAM: PORTABLE CHEST 1 VIEW COMPARISON:  02/05/2020 FINDINGS: Cardiac shadow is prominent but accentuated by the portable technique. Minimal right basilar atelectasis is noted. No focal confluent infiltrate is  seen. No bony abnormality is noted. IMPRESSION: Minimal right basilar atelectasis. Electronically Signed   By: Oneil Devonshire M.D.   On: 01/30/2024 02:54   CT ABDOMEN PELVIS WO CONTRAST Result Date: 01/29/2024 CLINICAL DATA:  Acute abdominal pain and nausea for several hours EXAM: CT ABDOMEN AND PELVIS WITHOUT CONTRAST TECHNIQUE: Multidetector CT imaging of the abdomen and pelvis was performed following the standard protocol without IV contrast. RADIATION DOSE REDUCTION: This exam was performed according to the departmental dose-optimization program which includes automated exposure control, adjustment of the mA and/or kV according to patient size and/or use of iterative reconstruction technique. COMPARISON:  06/07/2021 FINDINGS: Lower chest: Basilar atelectasis is noted. Hepatobiliary: No focal liver abnormality is seen. Status post cholecystectomy. No biliary dilatation. Pancreas: Unremarkable. No pancreatic ductal dilatation or surrounding inflammatory changes. Spleen: Normal in size without focal abnormality. Adrenals/Urinary Tract: Adrenal glands are within normal limits. Kidneys demonstrate nonobstructing renal calculi bilaterally. Fullness of the renal pelves are seen bilaterally although no definitive ureteral stones are seen. The bladder is well distended. Stable perinephric stranding is noted. Stomach/Bowel: No obstructive or inflammatory changes of the colon are noted. The appendix is within normal limits. Small bowel and stomach are unremarkable. Vascular/Lymphatic: Aortic atherosclerosis. No enlarged abdominal or pelvic lymph nodes. Reproductive: Prostate is  unremarkable. Other: No abdominal wall hernia or abnormality. No abdominopelvic ascites. Musculoskeletal: No acute or significant osseous findings. IMPRESSION: Bilateral nonobstructing renal calculi. No obstructive calculi are seen. Electronically Signed   By: Oneil Devonshire M.D.   On: 01/29/2024 22:58        Scheduled Meds:  allopurinol   100  mg Oral BID   amLODipine   5 mg Oral BID   heparin   5,000 Units Subcutaneous Q8H   insulin  aspart  0-5 Units Subcutaneous QHS   insulin  aspart  0-6 Units Subcutaneous TID WC   insulin  glargine-yfgn  20 Units Subcutaneous QHS   losartan   50 mg Oral Daily   sodium chloride  flush  3 mL Intravenous Q12H     LOS: 0 days    Time spent: 55 minutes    Greysyn Vanderberg JONETTA Fairly, DO [facility]  If 7PM-7AM, please contact night-coverage [email or web address] 01/30/2024, 11:18 AM

## 2024-01-30 NOTE — Care Management Obs Status (Signed)
 MEDICARE OBSERVATION STATUS NOTIFICATION   Patient Details  Name: Andre Calderon MRN: [id number] Date of Birth: 02/14/1941   Medicare Observation Status Notification Given:  Yes    Duwaine LITTIE Ada 01/30/2024, 2:40 PM

## 2024-01-30 NOTE — ED Notes (Signed)
 Attempted to get patient up and let him walk but patient could hardly sit up without feeling like the room was spinning. Pt states he feels extremely weak and nauseated. Pt also taken off o2 and sats while awake and talking stated between 91-92% but drops to 87% while sleeping. Pt placed back on his 2L. EDP made aware.

## 2024-01-30 NOTE — H&P (Signed)
 History and Physical    Andre Calderon FMW:[id number] DOB: Apr 25, 1941 DOA: 01/29/2024  PCP: Graydon Mt, MD   Patient coming from: Home   Chief Complaint: Room-spinning sensation, N/V   HPI: Andre Calderon is an 82 y.o. male with medical history significant for hypertension, hyperlipidemia, diabetes mellitus, gout, CKD stage IV, and CAD who presents with room spinning sensation, nausea, and vomiting.  Patient reports that he had just been getting over a recent upper respiratory infection and was beginning to feel much better until yesterday morning when he experienced a room spinning sensation upon rising.  Since then, the patient has continued to experience a room spinning sensation with nausea and vomiting.  Symptoms are better when he is laying down and worse anytime he tries to get up or look to the side.  There is no headache, change in vision or hearing, or focal numbness or weakness associated with this.  He denies fevers or chills.  Despite earlier documentation, he has not had any abdominal pain associated with this.    ED Course: Upon arrival to the ED, patient is found to be afebrile with normal HR, stable BP, and oxygen saturation in the mid to upper 90s while awake.  Labs are most notable for creatinine 2.71, WBC 10,300, and negative respiratory virus panel.    Patient was treated with a liter of LR, Zofran  x 2, and morphine  in the ED.  Review of Systems:  All other systems reviewed and apart from HPI, are negative.  Past Medical History:  Diagnosis Date   CAD (coronary artery disease) 01/30/2024   CKD (chronic kidney disease), stage IV (HCC) 02/14/2016   Diabetes mellitus without complication (HCC)    Gout 01/30/2024   Hypertension    Type 2 diabetes mellitus (HCC) 02/14/2016    Past Surgical History:  Procedure Laterality Date   CHOLECYSTECTOMY     COLONOSCOPY WITH PROPOFOL  N/A 06/15/2020   Procedure: COLONOSCOPY WITH PROPOFOL ;  Surgeon: Golda Claudis PENNER, MD;   [facility];  Service: Endoscopy;  Laterality: N/A;  900   POLYPECTOMY  06/15/2020   Procedure: POLYPECTOMY;  Surgeon: Golda Claudis PENNER, MD;  Location: [facility];  Service: Endoscopy;;  cecal, hepatic flexure, transverse     Social History:   reports that he has quit smoking. He has never used smokeless tobacco. He reports that he does not drink alcohol and does not use drugs.  Allergies  Allergen Reactions   Ibuprofen     Kidney Damage    Lisinopril     Cough and worsening renal function   Pioglitazone     Sugar too low    Simvastatin     Rash, muscle pain    History reviewed. No pertinent family history.   Prior to Admission medications   Medication Sig Start Date End Date Taking? Authorizing Provider  acetaminophen  (TYLENOL ) 500 MG tablet Take 1,000 mg by mouth every 6 (six) hours as needed for moderate pain or headache.    [provider]  allopurinol  (ZYLOPRIM ) 100 MG tablet Take 100 mg by mouth 2 (two) times daily.     [provider]  amLODipine  (NORVASC ) 5 MG tablet Take 5 mg by mouth 2 (two) times daily.     [provider]  Azelastine HCl 137 MCG/SPRAY SOLN Place 2 sprays into both nostrils 2 (two) times daily as needed (congestion). 12/24/19   [provider]  azithromycin (ZITHROMAX) 250 MG tablet Take 250-500 mg by mouth See admin  instructions. Take 500 mg on day 1 then take 250 mg daily for 4 days    [provider]  benzonatate (TESSALON) 100 MG capsule Take 200 mg by mouth 3 (three) times daily as needed for cough.    [provider]  bisacodyl (DULCOLAX) 5 MG EC tablet Take 5 mg by mouth 2 (two) times daily.    [provider]  Calcipotriene 0.005 % solution Apply 1 application topically daily.    [provider]  Cholecalciferol (VITAMIN D) 50 MCG (2000 UT) tablet Take 2,000 Units by mouth daily.    [provider]  docusate sodium  (COLACE) 100 MG capsule Take 1  capsule (100 mg total) by mouth 2 (two) times daily. 06/08/21   Neldon Hamp RAMAN, PA  glimepiride  (AMARYL ) 4 MG tablet Take 4 mg by mouth 2 (two) times daily.    [provider]  insulin  glargine (LANTUS ) 100 UNIT/ML injection Inject 15-30 Units into the skin See admin instructions. Inject 30 units in the morning and 15 units at night    [provider]  Insulin  Pen Needle (NOVOFINE) 32G X 6 MM MISC Inject into the skin. 09/09/18   [provider]  ketoconazole (NIZORAL) 2 % shampoo Apply 1 application topically 2 (two) times a week. 12/05/19   [provider]  Lancets (ONETOUCH DELICA PLUS [place]) MISC Apply 1 each topically daily. 12/24/19   [provider]  loratadine (CLARITIN) 10 MG tablet Take 10 mg by mouth daily.    [provider]  losartan  (COZAAR ) 100 MG tablet Take 50 mg by mouth daily.    [provider]  [facility] VERIO test strip 1 each daily. 12/24/19   [provider]  triamcinolone ointment (KENALOG) 0.1 % Apply 1 application topically daily. 12/07/19   [provider]  vitamin C (ASCORBIC ACID) 500 MG tablet Take 500 mg by mouth daily.    [provider]    Physical Exam: Vitals:   01/30/24 0220 01/30/24 0300 01/30/24 0330 01/30/24 0430  BP: 128/73 (!) 115/50 129/67 (!) 108/48  Pulse: 85 80 76 75  Resp: 17 18    Temp:      TempSrc:      SpO2: 95% 96% 96% 98%  Weight:      Height:        Constitutional: NAD, calm  Eyes: PERTLA, lids and conjunctivae normal ENMT: Mucous membranes are moist. Posterior pharynx clear of any exudate or lesions.   Neck: supple, no masses  Respiratory: no wheezing, no crackles. No accessory muscle use.  Cardiovascular: S1 & S2 heard, regular rate and rhythm. Mild lower extremity edema. No JVD. Abdomen: No tenderness, soft. Bowel sounds active.  Musculoskeletal: no clubbing / cyanosis. No joint deformity upper and lower extremities.   Skin: no significant  rashes, lesions, ulcers. Warm, dry, well-perfused. Neurologic: CN 2-12 grossly intact. Sensation to light touch intact, DTR normal. Strength 5/5 in all 4 limbs. Alert and oriented.  Psychiatric: Pleasant. Cooperative.    Labs and Imaging on Admission: I have personally reviewed following labs and imaging studies  CBC: Recent Labs  Lab 01/29/24 2043  WBC 10.3  HGB 12.6*  HCT 38.7*  MCV 89.2  PLT 259   Basic Metabolic Panel: Recent Labs  Lab 01/29/24 2043  NA 145  K 4.1  CL 113*  CO2 18*  GLUCOSE 192*  BUN 43*  CREATININE 2.71*  CALCIUM 9.3   GFR: Estimated Creatinine Clearance: 25.4 mL/min (A) (by C-G formula  based on SCr of 2.71 mg/dL (H)). Liver Function Tests: Recent Labs  Lab 01/29/24 2043  AST 24  ALT 22  ALKPHOS 77  BILITOT 0.5  PROT 8.2*  ALBUMIN 3.7   Recent Labs  Lab 01/29/24 2043  LIPASE 57*   No results for input(s): AMMONIA in the last 168 hours. Coagulation Profile: No results for input(s): INR, PROTIME in the last 168 hours. Cardiac Enzymes: No results for input(s): CKTOTAL, CKMB, CKMBINDEX, TROPONINI in the last 168 hours. BNP (last 3 results) No results for input(s): PROBNP in the last 8760 hours. HbA1C: No results for input(s): HGBA1C in the last 72 hours. CBG: No results for input(s): GLUCAP in the last 168 hours. Lipid Profile: No results for input(s): CHOL, HDL, LDLCALC, TRIG, CHOLHDL, LDLDIRECT in the last 72 hours. Thyroid  Function Tests: No results for input(s): TSH, T4TOTAL, FREET4, T3FREE, THYROIDAB in the last 72 hours. Anemia Panel: No results for input(s): VITAMINB12, FOLATE, FERRITIN, TIBC, IRON, RETICCTPCT in the last 72 hours. Urine analysis:    Component Value Date/Time   COLORURINE STRAW (A) 01/30/2024 0033   APPEARANCEUR CLEAR 01/30/2024 0033   LABSPEC 1.011 01/30/2024 0033   PHURINE 5.0 01/30/2024 0033   GLUCOSEU NEGATIVE 01/30/2024 0033   HGBUR NEGATIVE  01/30/2024 0033   BILIRUBINUR NEGATIVE 01/30/2024 0033   KETONESUR NEGATIVE 01/30/2024 0033   PROTEINUR NEGATIVE 01/30/2024 0033   NITRITE NEGATIVE 01/30/2024 0033   LEUKOCYTESUR NEGATIVE 01/30/2024 0033   Sepsis Labs: @LABRCNTIP (procalcitonin:4,lacticidven:4) ) Recent Results (from the past 240 hours)  Resp panel by RT-PCR (RSV, Flu A&B, Covid) Anterior Nasal Swab     Status: None   Collection Time: 01/30/24  2:24 AM   Specimen: Anterior Nasal Swab  Result Value Ref Range Status   SARS Coronavirus 2 by RT PCR NEGATIVE NEGATIVE Final    Comment: (NOTE) SARS-CoV-2 target nucleic acids are NOT DETECTED.  The SARS-CoV-2 RNA is generally detectable in upper respiratory specimens during the acute phase of infection. The lowest concentration of SARS-CoV-2 viral copies this assay can detect is 138 copies/mL. A negative result does not preclude SARS-Cov-2 infection and should not be used as the sole basis for treatment or other patient management decisions. A negative result may occur with  improper specimen collection/handling, submission of specimen other than nasopharyngeal swab, presence of viral mutation(s) within the areas targeted by this assay, and inadequate number of viral copies(<138 copies/mL). A negative result must be combined with clinical observations, patient history, and epidemiological information. The expected result is Negative.  Fact Sheet for Patients:  BloggerCourse.com  Fact Sheet for Healthcare Providers:  SeriousBroker.it  This test is no t yet approved or cleared by the [place]  FDA and  has been authorized for detection and/or diagnosis of SARS-CoV-2 by FDA under an Emergency Use Authorization (EUA). This EUA will remain  in effect (meaning this test can be used) for the duration of the COVID-19 declaration under Section 564(b)(1) of the Act, 21 U.S.C.section 360bbb-3(b)(1), unless the authorization  is terminated  or revoked sooner.       Influenza A by PCR NEGATIVE NEGATIVE Final   Influenza B by PCR NEGATIVE NEGATIVE Final    Comment: (NOTE) The Xpert Xpress SARS-CoV-2/FLU/RSV plus assay is intended as an aid in the diagnosis of influenza from Nasopharyngeal swab specimens and should not be used as a sole basis for treatment. Nasal washings and aspirates are unacceptable for Xpert Xpress SARS-CoV-2/FLU/RSV testing.  Fact Sheet for Patients: BloggerCourse.com  Fact Sheet for Healthcare Providers:  SeriousBroker.it  This test is not yet approved or cleared by the [place]  FDA and has been authorized for detection and/or diagnosis of SARS-CoV-2 by FDA under an Emergency Use Authorization (EUA). This EUA will remain in effect (meaning this test can be used) for the duration of the COVID-19 declaration under Section 564(b)(1) of the Act, 21 U.S.C. section 360bbb-3(b)(1), unless the authorization is terminated or revoked.     Resp Syncytial Virus by PCR NEGATIVE NEGATIVE Final    Comment: (NOTE) Fact Sheet for Patients: BloggerCourse.com  Fact Sheet for Healthcare Providers: SeriousBroker.it  This test is not yet approved or cleared by the [place]  FDA and has been authorized for detection and/or diagnosis of SARS-CoV-2 by FDA under an Emergency Use Authorization (EUA). This EUA will remain in effect (meaning this test can be used) for the duration of the COVID-19 declaration under Section 564(b)(1) of the Act, 21 U.S.C. section 360bbb-3(b)(1), unless the authorization is terminated or revoked.  Performed at [facility], [address], [place], [place] [address]      Radiological Exams on Admission: DG Chest [facility] 1 View Result Date: 01/30/2024 CLINICAL DATA:  Cough and hypoxia EXAM: PORTABLE CHEST 1 VIEW COMPARISON:  02/05/2020 FINDINGS: Cardiac shadow is  prominent but accentuated by the portable technique. Minimal right basilar atelectasis is noted. No focal confluent infiltrate is seen. No bony abnormality is noted. IMPRESSION: Minimal right basilar atelectasis. Electronically Signed   By: Oneil Devonshire M.D.   On: 01/30/2024 02:54   CT ABDOMEN PELVIS WO CONTRAST Result Date: 01/29/2024 CLINICAL DATA:  Acute abdominal pain and nausea for several hours EXAM: CT ABDOMEN AND PELVIS WITHOUT CONTRAST TECHNIQUE: Multidetector CT imaging of the abdomen and pelvis was performed following the standard protocol without IV contrast. RADIATION DOSE REDUCTION: This exam was performed according to the departmental dose-optimization program which includes automated exposure control, adjustment of the mA and/or kV according to patient size and/or use of iterative reconstruction technique. COMPARISON:  06/07/2021 FINDINGS: Lower chest: Basilar atelectasis is noted. Hepatobiliary: No focal liver abnormality is seen. Status post cholecystectomy. No biliary dilatation. Pancreas: Unremarkable. No pancreatic ductal dilatation or surrounding inflammatory changes. Spleen: Normal in size without focal abnormality. Adrenals/Urinary Tract: Adrenal glands are within normal limits. Kidneys demonstrate nonobstructing renal calculi bilaterally. Fullness of the renal pelves are seen bilaterally although no definitive ureteral stones are seen. The bladder is well distended. Stable perinephric stranding is noted. Stomach/Bowel: No obstructive or inflammatory changes of the colon are noted. The appendix is within normal limits. Small bowel and stomach are unremarkable. Vascular/Lymphatic: Aortic atherosclerosis. No enlarged abdominal or pelvic lymph nodes. Reproductive: Prostate is unremarkable. Other: No abdominal wall hernia or abnormality. No abdominopelvic ascites. Musculoskeletal: No acute or significant osseous findings. IMPRESSION: Bilateral nonobstructing renal calculi. No obstructive  calculi are seen. Electronically Signed   By: Oneil Devonshire M.D.   On: 01/29/2024 22:58    Assessment/Plan   1. Vertigo  - He is unable to sit up without significant symptoms despite IVF hydration and antiemetics in ED  - Check MRI brain, consult PT, and treat symptoms with as-needed antiemetics, antihistamines, and benzodiazepines    2. Hypertension  - Losartan  and amlodipine     3. Type II DM  - A1c was 8.3% in June 2025  - Check CBGs, continue basal insulin , add low-intensity SSI for now    4. CKD IV  - Appears close to baseline  - Renally-dose medications   5. CAD  - No anginal symptoms  DVT prophylaxis: sq heparin   Code Status: Full  Level of Care: Level of care: Telemetry Family Communication: Wife at bedside  Disposition Plan:  Patient is from: Home  Anticipated d/c is to: home  Anticipated d/c date is: 8/29 or 02/01/24  Patient currently: Pending MRI brain, symptom-management, PT eval Consults called: None  Admission status: Observation     Evalene GORMAN Sprinkles, MD [facility]  01/30/2024, 4:52 AM

## 2024-01-30 NOTE — Plan of Care (Signed)

## 2024-01-30 NOTE — Plan of Care (Signed)
  Problem: Acute Rehab PT Goals(only PT should resolve) Goal: Pt Will Go Supine/Side To Sit Outcome: Progressing Flowsheets (Taken 01/30/2024 1353) Pt will go Supine/Side to Sit:  with supervision  with contact guard assist Goal: Patient Will Transfer Sit To/From Stand Outcome: Progressing Flowsheets (Taken 01/30/2024 1353) Patient will transfer sit to/from stand:  with contact guard assist  with minimal assist Goal: Pt Will Transfer Bed To Chair/Chair To Bed Outcome: Progressing Flowsheets (Taken 01/30/2024 1353) Pt will Transfer Bed to Chair/Chair to Bed:  with contact guard assist  with min assist Goal: Pt Will Ambulate Outcome: Progressing Flowsheets (Taken 01/30/2024 1353) Pt will Ambulate:  25 feet  with minimal assist  with rolling walker  with least restrictive assistive device   1:53 PM, 01/30/24 Lynwood Music, MPT Physical Therapist with [facility] [phone] office [phone] mobile phone

## 2024-01-30 NOTE — TOC Initial Note (Signed)
 Transition of Care [facility]) - Initial/Assessment Note    Patient Details  Name: Andre Calderon MRN: [id number] Date of Birth: July 23, 1940  [facility]) CM/SW Contact:    Lucie Lunger, LCSWA Phone Number: 01/30/2024, 2:14 PM  Clinical Narrative:                 CSW notes PT is recommending SNF placement once medically stable for D/C. CSW spoke with pt and spouse at bedside to complete assessment. Pt is able to complete ADLs and his spouse drives when needed. Pt has a cane and walker to use when needed. CSW spoke with pt and spouse to review SNF placement, pt states he prefers to return home, wife is in agreement. They are agreeable to [facility] being arranged and do not have an agency preference. Pt does not wear O2 at baseline and if needed they do not have a DME preference. TOC to follow.   Expected Discharge Plan: Home w Home Health Services Barriers to Discharge: Continued Medical Work up   Patient Goals and CMS Choice Patient states their goals for this hospitalization and ongoing recovery are:: return home CMS Medicare.gov Compare Post Acute Care list provided to:: Patient Choice offered to / list presented to : Patient      Expected Discharge Plan and Services In-house Referral: Clinical Social Work Discharge Planning Services: CM Consult Post Acute Care Choice: Home Health Living arrangements for the past 2 months: Single Family Home                                      Prior Living Arrangements/Services Living arrangements for the past 2 months: Single Family Home Lives with:: Spouse Patient language and need for interpreter reviewed:: Yes Do you feel safe going back to the place where you live?: Yes      Need for Family Participation in Patient Care: Yes (Comment) Care giver support system in place?: Yes (comment) Current home services: DME Criminal Activity/Legal Involvement Pertinent to Current Situation/Hospitalization: No - Comment as  needed  Activities of Daily Living   ADL Screening (condition at time of admission) Independently performs ADLs?: Yes (appropriate for developmental age) Is the patient deaf or have difficulty hearing?: No Does the patient have difficulty seeing, even when wearing glasses/contacts?: No Does the patient have difficulty concentrating, remembering, or making decisions?: No  Permission Sought/Granted                  Emotional Assessment Appearance:: Appears stated age Attitude/Demeanor/Rapport: Engaged Affect (typically observed): Accepting Orientation: : Oriented to Self, Oriented to Place, Oriented to  Time, Oriented to Situation Alcohol / Substance Use: Not Applicable Psych Involvement: No (comment)  Admission diagnosis:  Generalized weakness [R53.1] Abdominal pain, unspecified abdominal location [R10.9] Abdominal pain with vomiting [R10.9, R11.10] Chronic kidney disease, unspecified CKD stage [N18.9] Nausea and vomiting, unspecified vomiting type [R11.2] Patient Active Problem List   Diagnosis Date Noted   Vertigo 01/30/2024   CAD (coronary artery disease) 01/30/2024   Gout 01/30/2024   Hypertension    CKD (chronic kidney disease), stage IV (HCC) 02/14/2016   Type 2 diabetes mellitus (HCC) 02/14/2016   PCP:  Graydon Mt, MD Pharmacy:   [facility]) - [place], [place] - [address] [facility] [address] [place] [address] Phone: [phone] Fax: [phone]  [facility] [address], [place] -  [address] [place] [address] Phone: [phone] Fax: [phone]     Social Drivers of Health (SDOH) Social History: SDOH Screenings   Food Insecurity: No Food Insecurity (01/30/2024)  Housing: Low Risk  (01/30/2024)  Transportation Needs: No Transportation Needs (01/30/2024)  Utilities: Not At Risk (01/30/2024)  Social Connections: Socially Integrated (01/30/2024)  Tobacco Use: Medium Risk  (01/30/2024)   SDOH Interventions:     Readmission Risk Interventions     No data to display

## 2024-01-31 DIAGNOSIS — R42 Dizziness and giddiness: Secondary | ICD-10-CM | POA: Diagnosis not present

## 2024-01-31 LAB — CBC
HCT: 31.2 % — ABNORMAL LOW (ref 39.0–52.0)
Hemoglobin: 10.2 g/dL — ABNORMAL LOW (ref 13.0–17.0)
MCH: 29.6 pg (ref 26.0–34.0)
MCHC: 32.7 g/dL (ref 30.0–36.0)
MCV: 90.4 fL (ref 80.0–100.0)
Platelets: 207 K/uL (ref 150–400)
RBC: 3.45 MIL/uL — ABNORMAL LOW (ref 4.22–5.81)
RDW: 15.6 % — ABNORMAL HIGH (ref 11.5–15.5)
WBC: 8.1 K/uL (ref 4.0–10.5)
nRBC: 0 % (ref 0.0–0.2)

## 2024-01-31 LAB — BASIC METABOLIC PANEL WITH GFR
Anion gap: 4 — ABNORMAL LOW (ref 5–15)
BUN: 36 mg/dL — ABNORMAL HIGH (ref 8–23)
CO2: 24 mmol/L (ref 22–32)
Calcium: 8.4 mg/dL — ABNORMAL LOW (ref 8.9–10.3)
Chloride: 114 mmol/L — ABNORMAL HIGH (ref 98–111)
Creatinine, Ser: 2.76 mg/dL — ABNORMAL HIGH (ref 0.61–1.24)
GFR, Estimated: 22 mL/min — ABNORMAL LOW (ref >60)
Glucose, Bld: 106 mg/dL — ABNORMAL HIGH (ref 70–99)
Potassium: 4.3 mmol/L (ref 3.5–5.1)
Sodium: 142 mmol/L (ref 135–145)

## 2024-01-31 LAB — GLUCOSE, CAPILLARY: Glucose-Capillary: 115 mg/dL — ABNORMAL HIGH (ref 70–99)

## 2024-01-31 LAB — MAGNESIUM: Magnesium: 2.1 mg/dL (ref 1.7–2.4)

## 2024-01-31 MED ORDER — FUROSEMIDE 40 MG PO TABS: 40.0000 mg | ORAL_TABLET | Freq: Every day | ORAL | 0 refills | Status: AC

## 2024-01-31 MED ORDER — GLIMEPIRIDE 4 MG PO TABS: 4.0000 mg | ORAL_TABLET | Freq: Every day | ORAL | 0 refills | Status: AC

## 2024-01-31 MED ORDER — MECLIZINE HCL 25 MG PO TABS: 25.0000 mg | ORAL_TABLET | Freq: Three times a day (TID) | ORAL | 0 refills | Status: AC | PRN

## 2024-01-31 MED ORDER — SCOPOLAMINE 1 MG/3DAYS TD PT72: 1.0000 | MEDICATED_PATCH | TRANSDERMAL | 0 refills | Status: AC

## 2024-01-31 NOTE — TOC Transition Note (Addendum)
 Transition of Care [facility]) - Discharge Note   Patient Details  Name: Andre Calderon MRN: [id number] Date of Birth: [birth year]  [facility]) CM/SW Contact:  Lucie Lunger, LCSWA Phone Number: 01/31/2024, 9:35 AM  Clinical Narrative:    CSW updated that pt is medically stable for D/C home today. MD placed [facility] PT/OT orders. CSW spoke to [place] with [facility] who states that they can accept [facility] PT/OT referral. CSW added agency info to AVS. Wife updated of [facility] agency info. CSW also updated by wife that they need a rolling walker for pt, CSW requested MD place orders. Wife requested it be drop shipped to the home. CSW sent referral to Zach with [employer] who will work on order. TOC signing off.   Final next level of care: Home w Home Health Services Barriers to Discharge: Barriers Resolved   Patient Goals and CMS Choice Patient states their goals for this hospitalization and ongoing recovery are:: return home CMS Medicare.gov Compare Post Acute Care list provided to:: Patient Choice offered to / list presented to : Patient      Discharge Placement                       Discharge Plan and Services Additional resources added to the After Visit Summary for   In-house Referral: Clinical Social Work Discharge Planning Services: CM Consult Post Acute Care Choice: Home Health                    HH Arranged: PT, OT [facility] Agency: [facility] Health Care Date [facility] Agency Contacted: 01/31/24   Representative spoke with at [facility] Agency: Darleene  Social Drivers of Health (SDOH) Interventions SDOH Screenings   Food Insecurity: No Food Insecurity (01/30/2024)  Housing: Low Risk  (01/30/2024)  Transportation Needs: No Transportation Needs (01/30/2024)  Utilities: Not At Risk (01/30/2024)  Social Connections: Socially Integrated (01/30/2024)  Tobacco Use: Medium Risk (01/30/2024)     Readmission Risk Interventions     No data to display

## 2024-01-31 NOTE — Progress Notes (Signed)
 Physical Therapy Treatment Patient Details Name: Andre Calderon MRN: [id number] DOB: 1940-06-10 Today's Date: 01/31/2024   History of Present Illness Andre Calderon is an 83 y.o. male with medical history significant for hypertension, hyperlipidemia, diabetes mellitus, gout, CKD stage IV, and CAD who presents with room spinning sensation, nausea, and vomiting.     Patient reports that he had just been getting over a recent upper respiratory infection and was beginning to feel much better until yesterday morning when he experienced a room spinning sensation upon rising.  Since then, the patient has continued to experience a room spinning sensation with nausea and vomiting.  Symptoms are better when he is laying down and worse anytime he tries to get up or look to the side.  There is no headache, change in vision or hearing, or focal numbness or weakness associated with this.  He denies fevers or chills.  Despite earlier documentation, he has not had any abdominal pain associated with this.    PT Comments  Patient demonstrates much improvement for functional mobility and ambulation using RW with less c/o dizziness.  Patient able stand without AD, but required use RW for gait training due to weakness, demonstrated good return for using RW without loss of balance and limited mostly due to fatigue/mild dizziness. PLAN:  Patient to be discharged home today and discharged from acute physical therapy to care of nursing for ambulation as tolerated for length of stay with recommendations stated below    If plan is discharge home, recommend the following: Help with stairs or ramp for entrance;Assistance with cooking/housework;Assist for transportation;A little help with walking and/or transfers;A little help with bathing/dressing/bathroom   Can travel by private vehicle     Yes  Equipment Recommendations  None recommended by PT    Recommendations for Other Services       Precautions / Restrictions  Precautions Precautions: Fall Recall of Precautions/Restrictions: Intact Restrictions Weight Bearing Restrictions Per Provider Order: No     Mobility  Bed Mobility Overal bed mobility: Modified Independent             General bed mobility comments: slightly labored movement    Transfers Overall transfer level: Needs assistance Equipment used: Rolling walker (2 wheels), None Transfers: Sit to/from Stand, Bed to chair/wheelchair/BSC Sit to Stand: Supervision, Contact guard assist   Step pivot transfers: Supervision, Contact guard assist       General transfer comment: able to stand without AD, but required use of RW due to generalized weakness and c/o mild dizziness    Ambulation/Gait Ambulation/Gait assistance: Supervision Gait Distance (Feet): 45 Feet Assistive device: Rolling walker (2 wheels) Gait Pattern/deviations: Decreased step length - left, Decreased stance time - right, Decreased stride length, Trunk flexed Gait velocity: decreased     General Gait Details: slightly labored movement without loss of balance, limited mostly due to fatigue using RW   Stairs             Wheelchair Mobility     Tilt Bed    Modified Rankin (Stroke Patients Only)       Balance Overall balance assessment: Needs assistance Sitting-balance support: No upper extremity supported Sitting balance-Leahy Scale: Good Sitting balance - Comments: seated at EOB   Standing balance support: During functional activity, No upper extremity supported Standing balance-Leahy Scale: Poor Standing balance comment: fair/poor without AD, fair/good using RW  Communication Communication Communication: No apparent difficulties  Cognition Arousal: Alert Behavior During Therapy: WFL for tasks assessed/performed   PT - Cognitive impairments: No apparent impairments                         Following commands: Intact      Cueing  Cueing Techniques: Verbal cues, Tactile cues  Exercises      General Comments        Pertinent Vitals/Pain Pain Assessment Pain Assessment: No/denies pain    Home Living                          Prior Function            PT Goals (current goals can now be found in the care plan section) Acute Rehab PT Goals Patient Stated Goal: return home with family to assist PT Goal Formulation: With patient/family Time For Goal Achievement: 01/31/24 Potential to Achieve Goals: Good Progress towards PT goals: Progressing toward goals    Frequency    Min 3X/week      PT Plan      Co-evaluation              AM-PAC PT 6 Clicks Mobility   Outcome Measure  Help needed turning from your back to your side while in a flat bed without using bedrails?: None Help needed moving from lying on your back to sitting on the side of a flat bed without using bedrails?: None Help needed moving to and from a bed to a chair (including a wheelchair)?: A Little Help needed standing up from a chair using your arms (e.g., wheelchair or bedside chair)?: A Little Help needed to walk in hospital room?: A Little Help needed climbing 3-5 steps with a railing? : A Lot 6 Click Score: 19    End of Session Equipment Utilized During Treatment: Gait belt Activity Tolerance: Patient tolerated treatment well;Patient limited by fatigue;Other (comment) Patient left: in bed;with call bell/phone within reach;with family/visitor present;Other (comment) (left seated at bedside) Nurse Communication: Mobility status PT Visit Diagnosis: Unsteadiness on feet (R26.81);Other abnormalities of gait and mobility (R26.89);Muscle weakness (generalized) (M62.81)     Time: 9160-9150 PT Time Calculation (min) (ACUTE ONLY): 10 min  Charges:    $Gait Training: 8-22 mins PT General Charges $$ ACUTE PT VISIT: 1 Visit                     11:15 AM, 01/31/24 Lynwood Music, MPT Physical Therapist with  [facility] 336 [phone] office [phone] mobile phone

## 2024-01-31 NOTE — Discharge Summary (Signed)
 Physician Discharge Summary  Andre Calderon FMW:[id number] DOB: [birth year] DOA: 01/29/2024  PCP: Graydon Mt, MD  Admit date: 01/29/2024  Discharge date: 01/31/2024  Admitted From:Home  Disposition:  Home  Recommendations for Outpatient Follow-up:  Follow up with PCP in 1-2 weeks Please obtain BMP in 1 week and reassess renal function Hold losartan  for now until reassessment is done and also plan to resume Lasix  in the next 72 hours Decreased dose of glimepiride  Continue other home medication as prior  Home Health: Yes with PT/OT  Equipment/Devices: None  Discharge Condition:Stable  CODE STATUS: Full  Diet recommendation: Heart Healthy/carb modified  Brief/Interim Summary: Andre Calderon is an 83 y.o. male with medical history significant for hypertension, hyperlipidemia, diabetes mellitus, gout, CKD stage IV, and CAD who presents with room spinning sensation, nausea, and vomiting.   Patient reports that he had just been getting over a recent upper respiratory infection and was beginning to feel much better until yesterday morning when he experienced a room spinning sensation upon rising.  He was admitted for evaluation of vertigo and has had brain MRI with no acute findings noted.  He is overall feeling much better this morning and in stable condition for discharge.  No other acute events or concerns noted.  He is noted to have some elevated creatinine levels that appear to be chronic, but this is uncertain.  Blood pressures are currently soft, therefore losartan  has been held and Lasix  will be held for another 3 days prior to reinitiation once dietary intake is further improved.  He will need to follow-up with a BMP outpatient with his PCP.  Discharge Diagnoses:  Principal Problem:   Vertigo Active Problems:   CKD (chronic kidney disease), stage IV (HCC)   Type 2 diabetes mellitus (HCC)   CAD (coronary artery disease)   Hypertension   Gout  Principal discharge diagnosis:  Vertigo likely in the setting of labyrinthitis/vestibular neuritis from recent URI.  Discharge Instructions  Discharge Instructions     Diet - low sodium heart healthy   Complete by: As directed    Increase activity slowly   Complete by: As directed       Allergies as of 01/31/2024       Reactions   Ibuprofen Other (See Comments)   Kidney Damage   Lisinopril Other (See Comments)   Cough and worsening renal function   Pioglitazone Other (See Comments)   Sugar too low   Simvastatin Rash, Other (See Comments)   Muscle pain        Medication List     STOP taking these medications    amoxicillin 250 MG capsule Commonly known as: AMOXIL   losartan  50 MG tablet Commonly known as: COZAAR        TAKE these medications    acetaminophen  500 MG tablet Commonly known as: TYLENOL  Take 1,000 mg by mouth every 6 (six) hours as needed for moderate pain or headache.   allopurinol  100 MG tablet Commonly known as: ZYLOPRIM  Take 100 mg by mouth 2 (two) times daily.   amLODipine  5 MG tablet Commonly known as: NORVASC  Take 5 mg by mouth 2 (two) times daily.   ascorbic acid 500 MG tablet Commonly known as: VITAMIN C Take 500 mg by mouth daily.   aspirin EC 81 MG tablet Take 81 mg by mouth daily. Swallow whole.   bisacodyl 5 MG EC tablet Commonly known as: DULCOLAX Take 5 mg by mouth 2 (two) times daily.   Calcipotriene 0.005 % solution  Apply 1 application topically daily.   furosemide  40 MG tablet Commonly known as: LASIX  Take 1 tablet (40 mg total) by mouth daily. Start taking on: February 03, 2024 What changed: These instructions start on February 03, 2024. If you are unsure what to do until then, ask your doctor or other care provider.   glimepiride  4 MG tablet Commonly known as: AMARYL  Take 1 tablet (4 mg total) by mouth daily with breakfast. What changed: when to take this   ketoconazole 2 % shampoo Commonly known as: NIZORAL Apply 1 application topically  2 (two) times a week.   Lantus  SoloStar 100 UNIT/ML Solostar Pen Generic drug: insulin  glargine Inject 15-40 Units into the skin in the morning and at bedtime. 40 units am and 15 units pm   loratadine 10 MG tablet Commonly known as: CLARITIN Take 10 mg by mouth daily.   meclizine  25 MG tablet Commonly known as: ANTIVERT  Take 1 tablet (25 mg total) by mouth 3 (three) times daily as needed for dizziness.   metoprolol succinate 25 MG 24 hr tablet Commonly known as: TOPROL-XL Take 25 mg by mouth daily.   NovoFine 32G X 6 MM Misc Generic drug: Insulin  Pen Needle Inject into the skin.   OneTouch Delica Plus Lancet33G Misc Apply 1 each topically daily.   OneTouch Verio test strip Generic drug: glucose blood 1 each daily.   scopolamine  1 MG/3DAYS Commonly known as: TRANSDERM-SCOP Place 1 patch (1 mg total) onto the skin every 3 (three) days. Start taking on: February 02, 2024   Vitamin D 50 MCG (2000 UT) tablet Take 2,000 Units by mouth every other day.        Follow-up Information     Graydon Mt, MD. Schedule an appointment as soon as possible for a visit in 1 week(s).   Specialty: Internal Medicine Contact information: [address] [place] [address] [phone]                Allergies  Allergen Reactions   Ibuprofen Other (See Comments)    Kidney Damage    Lisinopril Other (See Comments)    Cough and worsening renal function   Pioglitazone Other (See Comments)    Sugar too low    Simvastatin Rash and Other (See Comments)    Muscle pain    Consultations: None   Procedures/Studies: MR BRAIN WO CONTRAST Result Date: 01/30/2024 EXAM: MR Brain without Intravenous Contrast. CLINICAL HISTORY: Neuro deficit, acute, stroke suspected. Neuro deficit, acute, stroke suspected. Neuro deficit, acute, stroke suspected. Neuro deficit, acute, stroke suspected. TECHNIQUE: Magnetic resonance images of the brain without intravenous contrast in multiple  planes. CONTRAST: None. COMPARISON: CT of the head dated 12/07/2007. FINDINGS: BRAIN: No restricted diffusion to indicate acute infarction. No intracranial mass or hemorrhage. No midline shift or extra-axial fluid collection. No cerebellar tonsillar ectopia. The central arterial and venous flow voids are patent. There is age-related atrophy and mild periventricular and subcortical white matter disease. VENTRICLES: No hydrocephalus. ORBITS: The patient is status post bilateral lens replacement. SINUSES AND MASTOIDS: The sinuses and mastoid air cells are clear. BONES: No acute fracture or focal osseous lesion. 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 There are extensive anterior osteophytes also noted in the upper cervical spine. IMPRESSION: 1. No acute findings. 2. Age-related atrophy and mild periventricular and subcortical white matter disease. 3. 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 Electronically signed by: Evalene Coho MD 01/30/2024 10:49 AM EDT RP Workstation: [id number]   DG Chest Port 1 View Result Date: 01/30/2024 CLINICAL DATA:  Cough and hypoxia EXAM: PORTABLE CHEST 1 VIEW COMPARISON:  02/05/2020 FINDINGS: Cardiac shadow is prominent but accentuated by the portable technique. Minimal right basilar atelectasis is noted. No focal confluent infiltrate is seen. No bony abnormality is noted. IMPRESSION: Minimal right basilar atelectasis. Electronically Signed   By: Oneil Devonshire M.D.   On: 01/30/2024 02:54   CT ABDOMEN PELVIS WO CONTRAST Result Date: 01/29/2024 CLINICAL DATA:  Acute abdominal pain and nausea for several hours EXAM: CT ABDOMEN AND PELVIS WITHOUT CONTRAST TECHNIQUE: Multidetector CT imaging of the abdomen and pelvis was  performed following the standard protocol without IV contrast. RADIATION DOSE REDUCTION: This exam was performed according to the departmental dose-optimization program which includes automated exposure control, adjustment of the mA and/or kV according to patient size and/or use of iterative reconstruction technique. COMPARISON:  06/07/2021 FINDINGS: Lower chest: Basilar atelectasis is noted. Hepatobiliary: No focal liver abnormality is seen. Status post cholecystectomy. No biliary dilatation. Pancreas: Unremarkable. No pancreatic ductal dilatation or surrounding inflammatory changes. Spleen: Normal in size without focal abnormality. Adrenals/Urinary Tract: Adrenal glands are within normal limits. Kidneys demonstrate nonobstructing renal calculi bilaterally. Fullness of the renal pelves are seen bilaterally although no definitive ureteral stones are seen. The bladder is well distended. Stable perinephric stranding is noted. Stomach/Bowel: No obstructive or inflammatory changes of the colon are noted. The appendix is within normal limits. Small bowel and stomach are unremarkable. Vascular/Lymphatic: Aortic atherosclerosis. No enlarged abdominal or pelvic lymph nodes. Reproductive: Prostate is unremarkable. Other: No abdominal wall hernia or abnormality. No abdominopelvic ascites. Musculoskeletal: No acute or significant osseous findings. IMPRESSION: Bilateral nonobstructing renal calculi. No obstructive calculi are seen. Electronically Signed   By: Oneil Devonshire M.D.   On: 01/29/2024 22:58     Discharge Exam: Vitals:   01/30/24 1933 01/31/24 0335  BP: 139/85 (!) 119/54  Pulse: 94 88  Resp: 17 18  Temp: 98.2 F (36.8 C) 98.8 F (37.1 C)  SpO2: 92% 90%   Vitals:   01/30/24 0800 01/30/24 1516 01/30/24 1933 01/31/24 0335  BP: 113/63  139/85 (!) 119/54  Pulse:  80 94 88  Resp:  12 17 18   Temp:  97.7 F (36.5 C) 98.2 F (36.8 C) 98.8 F (37.1 C)  TempSrc:  Oral Oral Oral  SpO2:  96% 92% 90%   Weight:      Height:        General: Pt is alert, awake, not in acute distress Cardiovascular: RRR, S1/S2 +, no rubs, no gallops Respiratory: CTA bilaterally, no wheezing, no rhonchi Abdominal: Soft, NT, ND, bowel sounds + Extremities: no edema, no cyanosis    The results of significant diagnostics from this hospitalization (including imaging, microbiology, ancillary and laboratory) are listed below for reference.     Microbiology: Recent Results (from the past 240 hours)  Resp panel by RT-PCR (RSV, Flu A&B, Covid) Anterior Nasal Swab     Status: None   Collection Time: 01/30/24  2:24 AM   Specimen: Anterior Nasal Swab  Result Value Ref Range Status   SARS Coronavirus 2 by RT PCR NEGATIVE NEGATIVE Final    Comment: (NOTE) SARS-CoV-2 target nucleic acids are NOT DETECTED.  The SARS-CoV-2 RNA is generally detectable in upper respiratory specimens during the acute phase of infection. The lowest  concentration of SARS-CoV-2 viral copies this assay can detect is 138 copies/mL. A negative result does not preclude SARS-Cov-2 infection and should not be used as the sole basis for treatment or other patient management decisions. A negative result may occur with  improper specimen collection/handling, submission of specimen other than nasopharyngeal swab, presence of viral mutation(s) within the areas targeted by this assay, and inadequate number of viral copies(<138 copies/mL). A negative result must be combined with clinical observations, patient history, and epidemiological information. The expected result is Negative.  Fact Sheet for Patients:  BloggerCourse.com  Fact Sheet for Healthcare Providers:  SeriousBroker.it  This test is no t yet approved or cleared by the [place]  FDA and  has been authorized for detection and/or diagnosis of SARS-CoV-2 by FDA under an Emergency Use Authorization (EUA). This EUA will remain  in  effect (meaning this test can be used) for the duration of the COVID-19 declaration under Section 564(b)(1) of the Act, 21 U.S.C.section 360bbb-3(b)(1), unless the authorization is terminated  or revoked sooner.       Influenza A by PCR NEGATIVE NEGATIVE Final   Influenza B by PCR NEGATIVE NEGATIVE Final    Comment: (NOTE) The Xpert Xpress SARS-CoV-2/FLU/RSV plus assay is intended as an aid in the diagnosis of influenza from Nasopharyngeal swab specimens and should not be used as a sole basis for treatment. Nasal washings and aspirates are unacceptable for Xpert Xpress SARS-CoV-2/FLU/RSV testing.  Fact Sheet for Patients: BloggerCourse.com  Fact Sheet for Healthcare Providers: SeriousBroker.it  This test is not yet approved or cleared by the [place]  FDA and has been authorized for detection and/or diagnosis of SARS-CoV-2 by FDA under an Emergency Use Authorization (EUA). This EUA will remain in effect (meaning this test can be used) for the duration of the COVID-19 declaration under Section 564(b)(1) of the Act, 21 U.S.C. section 360bbb-3(b)(1), unless the authorization is terminated or revoked.     Resp Syncytial Virus by PCR NEGATIVE NEGATIVE Final    Comment: (NOTE) Fact Sheet for Patients: BloggerCourse.com  Fact Sheet for Healthcare Providers: SeriousBroker.it  This test is not yet approved or cleared by the [place]  FDA and has been authorized for detection and/or diagnosis of SARS-CoV-2 by FDA under an Emergency Use Authorization (EUA). This EUA will remain in effect (meaning this test can be used) for the duration of the COVID-19 declaration under Section 564(b)(1) of the Act, 21 U.S.C. section 360bbb-3(b)(1), unless the authorization is terminated or revoked.  Performed at [facility], [address], [place], [place] [address]      Labs: BNP (last  3 results) No results for input(s): BNP in the last 8760 hours. Basic Metabolic Panel: Recent Labs  Lab 01/29/24 2043 01/30/24 0531 01/31/24 0333  NA 145 147* 142  K 4.1 4.7 4.3  CL 113* 116* 114*  CO2 18* 22 24  GLUCOSE 192* 136* 106*  BUN 43* 41* 36*  CREATININE 2.71* 2.71* 2.76*  CALCIUM 9.3 8.8* 8.4*  MG  --   --  2.1   Liver Function Tests: Recent Labs  Lab 01/29/24 2043  AST 24  ALT 22  ALKPHOS 77  BILITOT 0.5  PROT 8.2*  ALBUMIN 3.7   Recent Labs  Lab 01/29/24 2043  LIPASE 57*   No results for input(s): AMMONIA in the last 168 hours. CBC: Recent Labs  Lab 01/29/24 2043 01/30/24 0531 01/31/24 0333  WBC 10.3 8.7 8.1  HGB 12.6* 11.4* 10.2*  HCT 38.7* 35.7* 31.2*  MCV  89.2 90.8 90.4  PLT 259 182 207   Cardiac Enzymes: No results for input(s): CKTOTAL, CKMB, CKMBINDEX, TROPONINI in the last 168 hours. BNP: Invalid input(s): POCBNP CBG: Recent Labs  Lab 01/30/24 0736 01/30/24 1204 01/30/24 1653 01/30/24 1935 01/31/24 0716  GLUCAP 125* 152* 209* 245* 115*   D-Dimer No results for input(s): DDIMER in the last 72 hours. Hgb A1c No results for input(s): HGBA1C in the last 72 hours. Lipid Profile No results for input(s): CHOL, HDL, LDLCALC, TRIG, CHOLHDL, LDLDIRECT in the last 72 hours. Thyroid  function studies No results for input(s): TSH, T4TOTAL, T3FREE, THYROIDAB in the last 72 hours.  Invalid input(s): FREET3 Anemia work up No results for input(s): VITAMINB12, FOLATE, FERRITIN, TIBC, IRON, RETICCTPCT in the last 72 hours. Urinalysis    Component Value Date/Time   COLORURINE STRAW (A) 01/30/2024 0033   APPEARANCEUR CLEAR 01/30/2024 0033   LABSPEC 1.011 01/30/2024 0033   PHURINE 5.0 01/30/2024 0033   GLUCOSEU NEGATIVE 01/30/2024 0033   HGBUR NEGATIVE 01/30/2024 0033   BILIRUBINUR NEGATIVE 01/30/2024 0033   KETONESUR NEGATIVE 01/30/2024 0033   PROTEINUR NEGATIVE 01/30/2024 0033    NITRITE NEGATIVE 01/30/2024 0033   LEUKOCYTESUR NEGATIVE 01/30/2024 0033   Sepsis Labs Recent Labs  Lab 01/29/24 2043 01/30/24 0531 01/31/24 0333  WBC 10.3 8.7 8.1   Microbiology Recent Results (from the past 240 hours)  Resp panel by RT-PCR (RSV, Flu A&B, Covid) Anterior Nasal Swab     Status: None   Collection Time: 01/30/24  2:24 AM   Specimen: Anterior Nasal Swab  Result Value Ref Range Status   SARS Coronavirus 2 by RT PCR NEGATIVE NEGATIVE Final    Comment: (NOTE) SARS-CoV-2 target nucleic acids are NOT DETECTED.  The SARS-CoV-2 RNA is generally detectable in upper respiratory specimens during the acute phase of infection. The lowest concentration of SARS-CoV-2 viral copies this assay can detect is 138 copies/mL. A negative result does not preclude SARS-Cov-2 infection and should not be used as the sole basis for treatment or other patient management decisions. A negative result may occur with  improper specimen collection/handling, submission of specimen other than nasopharyngeal swab, presence of viral mutation(s) within the areas targeted by this assay, and inadequate number of viral copies(<138 copies/mL). A negative result must be combined with clinical observations, patient history, and epidemiological information. The expected result is Negative.  Fact Sheet for Patients:  BloggerCourse.com  Fact Sheet for Healthcare Providers:  SeriousBroker.it  This test is no t yet approved or cleared by the [place]  FDA and  has been authorized for detection and/or diagnosis of SARS-CoV-2 by FDA under an Emergency Use Authorization (EUA). This EUA will remain  in effect (meaning this test can be used) for the duration of the COVID-19 declaration under Section 564(b)(1) of the Act, 21 U.S.C.section 360bbb-3(b)(1), unless the authorization is terminated  or revoked sooner.       Influenza A by PCR NEGATIVE NEGATIVE  Final   Influenza B by PCR NEGATIVE NEGATIVE Final    Comment: (NOTE) The Xpert Xpress SARS-CoV-2/FLU/RSV plus assay is intended as an aid in the diagnosis of influenza from Nasopharyngeal swab specimens and should not be used as a sole basis for treatment. Nasal washings and aspirates are unacceptable for Xpert Xpress SARS-CoV-2/FLU/RSV testing.  Fact Sheet for Patients: BloggerCourse.com  Fact Sheet for Healthcare Providers: SeriousBroker.it  This test is not yet approved or cleared by the [place]  FDA and has been authorized for detection and/or diagnosis of SARS-CoV-2 by  FDA under an Emergency Use Authorization (EUA). This EUA will remain in effect (meaning this test can be used) for the duration of the COVID-19 declaration under Section 564(b)(1) of the Act, 21 U.S.C. section 360bbb-3(b)(1), unless the authorization is terminated or revoked.     Resp Syncytial Virus by PCR NEGATIVE NEGATIVE Final    Comment: (NOTE) Fact Sheet for Patients: BloggerCourse.com  Fact Sheet for Healthcare Providers: SeriousBroker.it  This test is not yet approved or cleared by the [place]  FDA and has been authorized for detection and/or diagnosis of SARS-CoV-2 by FDA under an Emergency Use Authorization (EUA). This EUA will remain in effect (meaning this test can be used) for the duration of the COVID-19 declaration under Section 564(b)(1) of the Act, 21 U.S.C. section 360bbb-3(b)(1), unless the authorization is terminated or revoked.  Performed at [facility], [address] [place], [place] [address]      Time coordinating discharge: 35 minutes  SIGNED:   Adron JONETTA Fairly, DO [facility] 01/31/2024, 9:28 AM  If 7PM-7AM, please contact night-coverage [email or web address]

## 2024-01-31 NOTE — Progress Notes (Signed)
 Discharge teaching complete with wife and patient. Meds, diet, activity, follow up appointments reviewed and all questions answered. Copy of instructions given to patient and prescriptions sent to pharmacy.
